# Patient Record
Sex: Male | Born: 1935 | Race: White | Hispanic: No | Marital: Married | Smoking: Never smoker
Health system: Southern US, Community
[De-identification: ages and names within clinical notes are randomized; demographics above are authoritative.]

## PROBLEM LIST (undated history)

## (undated) DIAGNOSIS — F0390 Unspecified dementia without behavioral disturbance: Secondary | ICD-10-CM

## (undated) DIAGNOSIS — Z862 Personal history of diseases of the blood and blood-forming organs and certain disorders involving the immune mechanism: Secondary | ICD-10-CM

## (undated) DIAGNOSIS — H3530 Unspecified macular degeneration: Secondary | ICD-10-CM

## (undated) DIAGNOSIS — I10 Essential (primary) hypertension: Secondary | ICD-10-CM

## (undated) DIAGNOSIS — I429 Cardiomyopathy, unspecified: Secondary | ICD-10-CM

## (undated) DIAGNOSIS — I4821 Permanent atrial fibrillation: Secondary | ICD-10-CM

## (undated) DIAGNOSIS — E119 Type 2 diabetes mellitus without complications: Secondary | ICD-10-CM

## (undated) DIAGNOSIS — K449 Diaphragmatic hernia without obstruction or gangrene: Secondary | ICD-10-CM

## (undated) DIAGNOSIS — Z7901 Long term (current) use of anticoagulants: Secondary | ICD-10-CM

## (undated) HISTORY — PX: HIP SURGERY: SHX245

## (undated) HISTORY — DX: Essential (primary) hypertension: I10

## (undated) HISTORY — PX: PROSTATE SURGERY: SHX751

## (undated) HISTORY — DX: Permanent atrial fibrillation: I48.21

## (undated) HISTORY — DX: Unspecified macular degeneration: H35.30

## (undated) HISTORY — DX: Personal history of diseases of the blood and blood-forming organs and certain disorders involving the immune mechanism: Z86.2

## (undated) HISTORY — DX: Unspecified dementia, unspecified severity, without behavioral disturbance, psychotic disturbance, mood disturbance, and anxiety: F03.90

## (undated) HISTORY — PX: CHOLECYSTECTOMY: SHX55

## (undated) HISTORY — DX: Type 2 diabetes mellitus without complications: E11.9

## (undated) HISTORY — DX: Long term (current) use of anticoagulants: Z79.01

## (undated) HISTORY — DX: Diaphragmatic hernia without obstruction or gangrene: K44.9

## (undated) HISTORY — DX: Cardiomyopathy, unspecified: I42.9

---

## 2007-02-13 ENCOUNTER — Ambulatory Visit: Payer: Self-pay | Admitting: Cardiology

## 2007-02-18 ENCOUNTER — Ambulatory Visit: Payer: Self-pay | Admitting: Cardiology

## 2007-03-24 ENCOUNTER — Ambulatory Visit: Payer: Self-pay | Admitting: Cardiology

## 2007-04-01 ENCOUNTER — Ambulatory Visit: Payer: Self-pay | Admitting: Cardiology

## 2007-04-27 ENCOUNTER — Ambulatory Visit: Payer: Self-pay | Admitting: Cardiology

## 2007-04-29 ENCOUNTER — Ambulatory Visit: Payer: Self-pay | Admitting: Cardiology

## 2007-05-06 ENCOUNTER — Ambulatory Visit: Payer: Self-pay | Admitting: Cardiology

## 2007-05-13 ENCOUNTER — Ambulatory Visit: Payer: Self-pay | Admitting: Cardiology

## 2007-05-20 ENCOUNTER — Ambulatory Visit: Payer: Self-pay | Admitting: Cardiology

## 2007-06-08 ENCOUNTER — Ambulatory Visit: Payer: Self-pay | Admitting: Cardiology

## 2007-07-03 ENCOUNTER — Encounter: Payer: Self-pay | Admitting: Cardiology

## 2007-07-04 ENCOUNTER — Encounter: Payer: Self-pay | Admitting: Cardiology

## 2007-07-06 ENCOUNTER — Ambulatory Visit: Payer: Self-pay | Admitting: Cardiology

## 2007-08-03 ENCOUNTER — Ambulatory Visit: Payer: Self-pay | Admitting: Cardiology

## 2007-08-31 ENCOUNTER — Ambulatory Visit: Payer: Self-pay | Admitting: Cardiology

## 2007-09-28 ENCOUNTER — Ambulatory Visit: Payer: Self-pay | Admitting: Cardiology

## 2007-10-26 ENCOUNTER — Ambulatory Visit: Payer: Self-pay | Admitting: Cardiology

## 2007-11-15 ENCOUNTER — Encounter: Payer: Self-pay | Admitting: Cardiology

## 2007-11-16 ENCOUNTER — Ambulatory Visit: Payer: Self-pay | Admitting: Internal Medicine

## 2007-11-16 ENCOUNTER — Inpatient Hospital Stay (HOSPITAL_COMMUNITY): Admission: AD | Admit: 2007-11-16 | Discharge: 2007-11-25 | Payer: Self-pay | Admitting: Orthopedic Surgery

## 2007-11-25 ENCOUNTER — Encounter: Payer: Self-pay | Admitting: Cardiology

## 2008-01-11 ENCOUNTER — Ambulatory Visit: Payer: Self-pay | Admitting: Cardiology

## 2008-02-16 ENCOUNTER — Ambulatory Visit: Payer: Self-pay | Admitting: Cardiology

## 2008-02-22 ENCOUNTER — Ambulatory Visit: Payer: Self-pay | Admitting: Cardiology

## 2008-03-08 ENCOUNTER — Ambulatory Visit: Payer: Self-pay | Admitting: Cardiology

## 2008-03-22 ENCOUNTER — Ambulatory Visit: Payer: Self-pay | Admitting: Cardiology

## 2008-03-29 ENCOUNTER — Encounter: Payer: Self-pay | Admitting: Cardiology

## 2008-04-12 ENCOUNTER — Ambulatory Visit: Payer: Self-pay | Admitting: Cardiology

## 2008-04-15 ENCOUNTER — Ambulatory Visit: Payer: Self-pay | Admitting: Cardiology

## 2008-04-29 ENCOUNTER — Ambulatory Visit: Payer: Self-pay | Admitting: Cardiology

## 2008-05-27 ENCOUNTER — Ambulatory Visit: Payer: Self-pay | Admitting: Cardiology

## 2008-06-17 ENCOUNTER — Ambulatory Visit: Payer: Self-pay | Admitting: Cardiology

## 2008-08-02 ENCOUNTER — Ambulatory Visit: Payer: Self-pay | Admitting: Cardiology

## 2008-08-23 ENCOUNTER — Ambulatory Visit: Payer: Self-pay | Admitting: Cardiology

## 2008-09-20 ENCOUNTER — Ambulatory Visit: Payer: Self-pay | Admitting: Cardiology

## 2008-09-27 ENCOUNTER — Ambulatory Visit: Payer: Self-pay | Admitting: Cardiology

## 2008-09-30 ENCOUNTER — Ambulatory Visit: Payer: Self-pay | Admitting: Cardiology

## 2008-09-30 IMAGING — RF DG FEMUR 2+V*R*
1 series · 4 of 4 positions shown · non-contrast
Comparison: None available.

CLINICAL DATA: Periprosthetic femur fracture

RIGHT FEMUR - 2 VIEW

[Series 1: run · 4 of 4 slices shown]
[im 1/4]
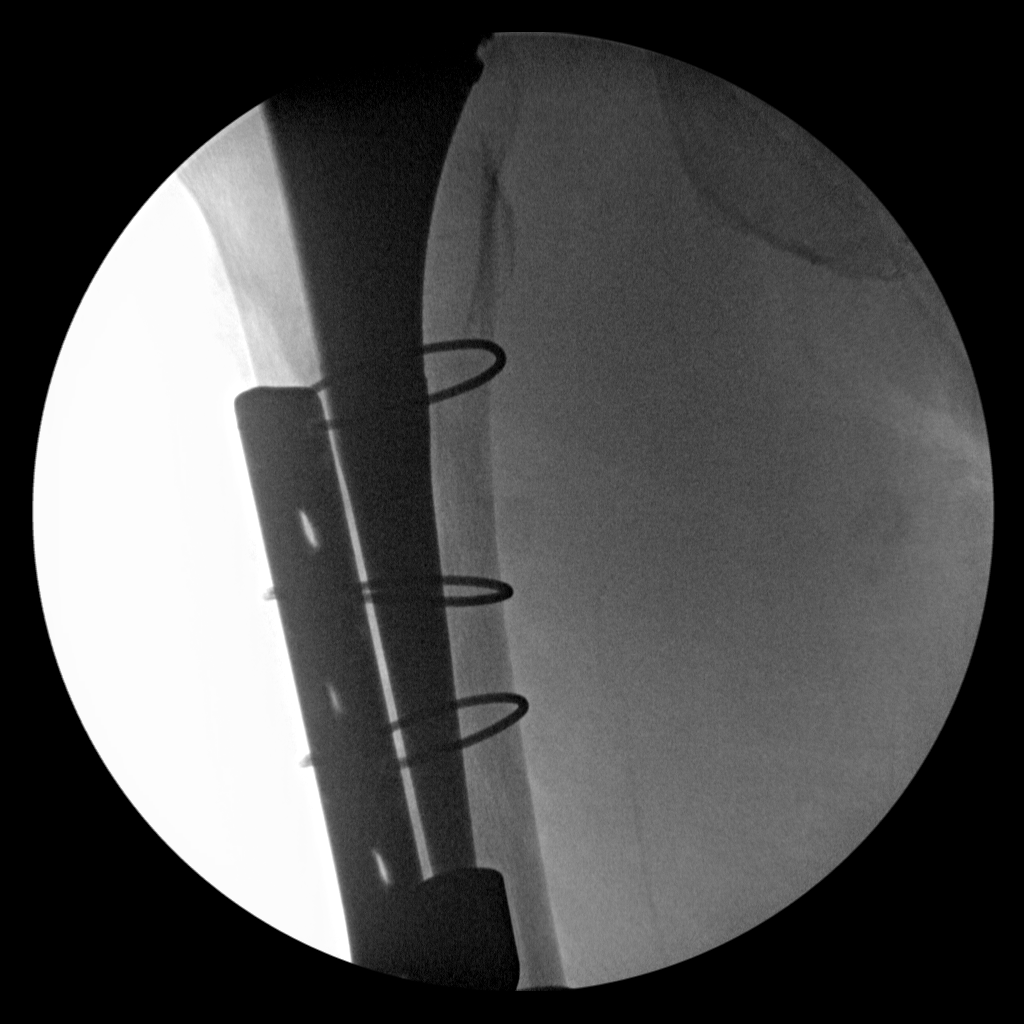
[im 2/4]
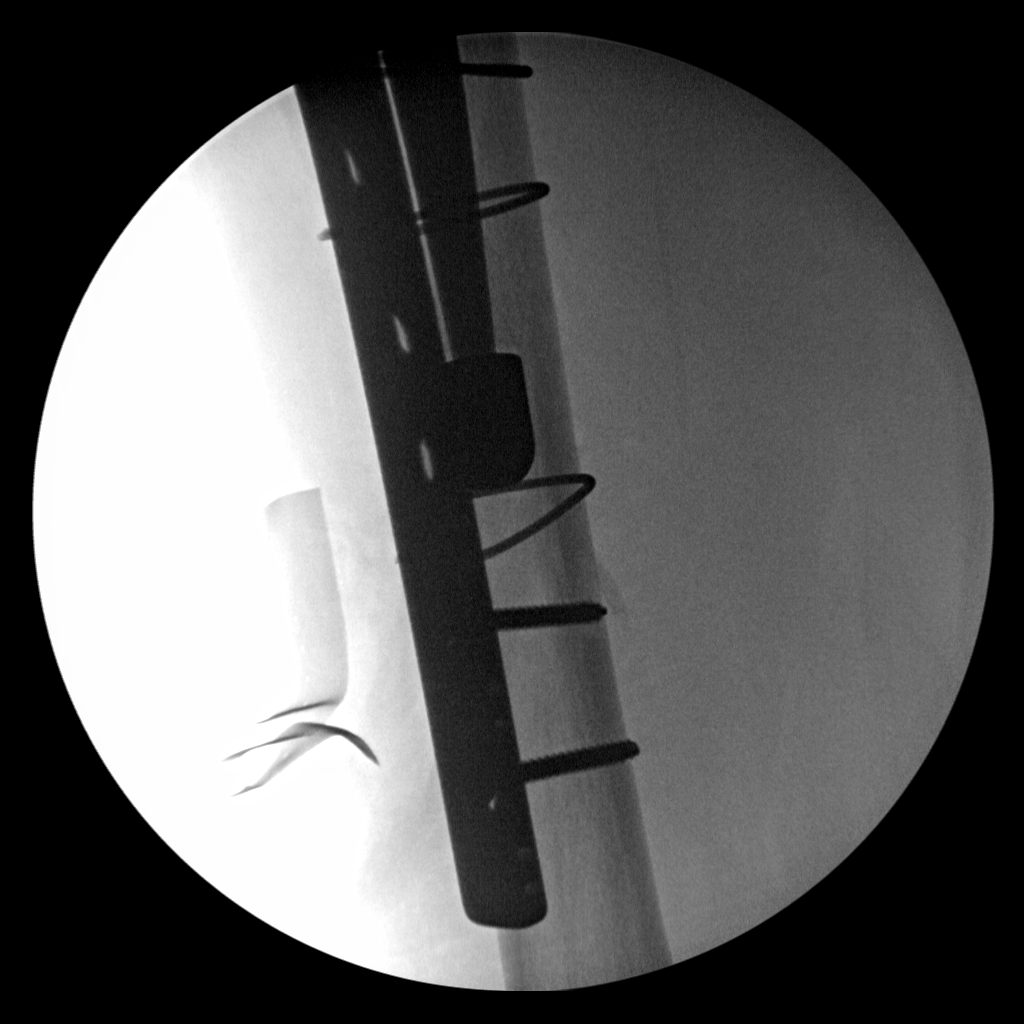
[im 3/4]
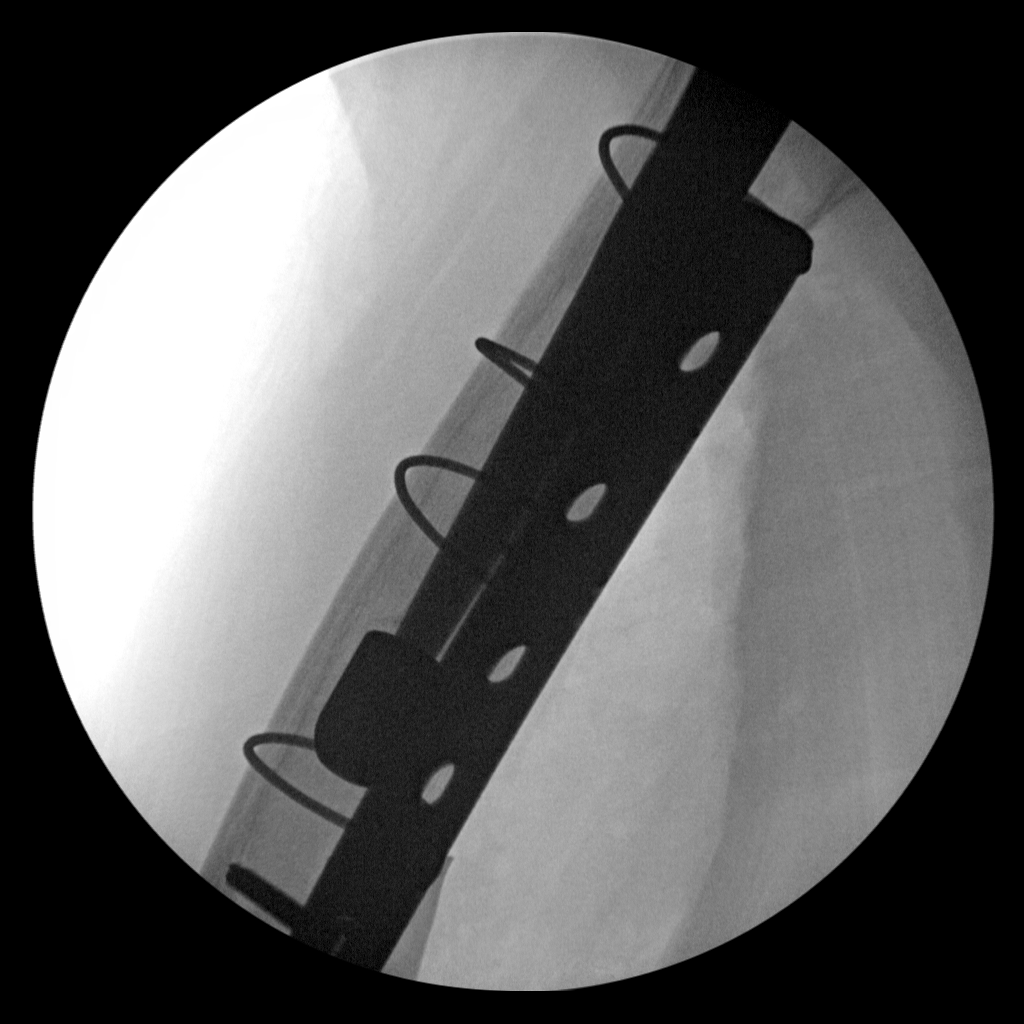
[im 4/4]
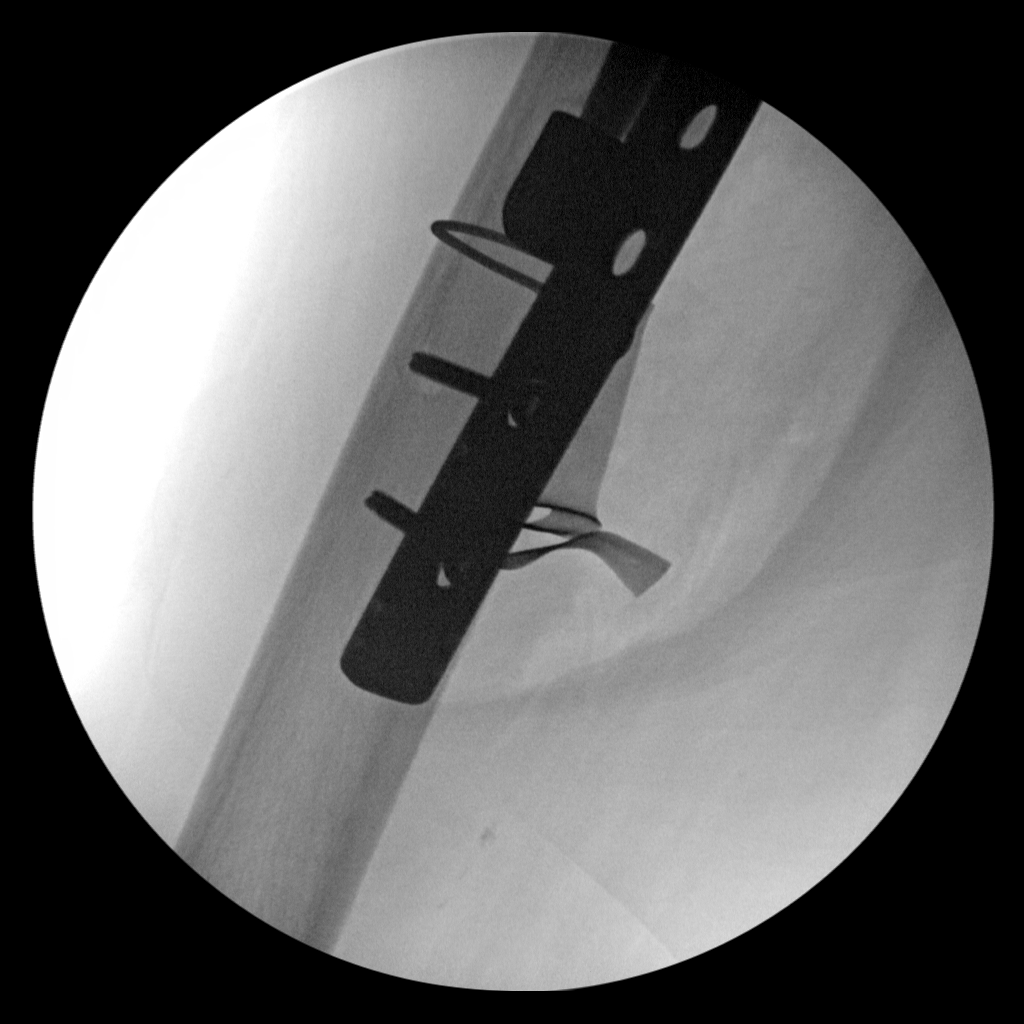

[4 of 4 positions shown; findings below may reference images not displayed]

FINDINGS: We are provided with four fluoroscopic intraoperative
spot views demonstrating placement of a plate with screws and
cerclage wires about the distal to thirds of a femoral stem of the
right hip replacement.
IMPRESSION: ORIF periprosthetic fracture right femur

## 2008-10-04 ENCOUNTER — Encounter: Payer: Self-pay | Admitting: Cardiology

## 2008-10-07 ENCOUNTER — Encounter: Payer: Self-pay | Admitting: Cardiology

## 2008-10-18 ENCOUNTER — Ambulatory Visit: Payer: Self-pay | Admitting: Cardiology

## 2008-10-28 ENCOUNTER — Ambulatory Visit: Payer: Self-pay | Admitting: Cardiology

## 2008-11-04 ENCOUNTER — Ambulatory Visit: Payer: Self-pay | Admitting: Cardiology

## 2008-11-08 ENCOUNTER — Ambulatory Visit: Payer: Self-pay

## 2008-11-08 ENCOUNTER — Encounter: Payer: Self-pay | Admitting: Cardiology

## 2008-11-14 ENCOUNTER — Ambulatory Visit: Payer: Self-pay | Admitting: Cardiology

## 2008-11-29 ENCOUNTER — Ambulatory Visit: Payer: Self-pay | Admitting: Cardiology

## 2008-12-27 ENCOUNTER — Ambulatory Visit: Payer: Self-pay

## 2009-01-17 ENCOUNTER — Ambulatory Visit: Payer: Self-pay | Admitting: Cardiology

## 2009-01-23 ENCOUNTER — Encounter: Payer: Self-pay | Admitting: *Deleted

## 2009-02-07 ENCOUNTER — Ambulatory Visit: Payer: Self-pay | Admitting: Cardiology

## 2009-02-07 LAB — CONVERTED CEMR LAB
POC INR: 2.4
Prothrombin Time: 18.8 s

## 2009-03-07 ENCOUNTER — Ambulatory Visit: Payer: Self-pay | Admitting: Cardiology

## 2009-03-07 LAB — CONVERTED CEMR LAB: POC INR: 2.7

## 2009-03-18 DIAGNOSIS — R5383 Other fatigue: Secondary | ICD-10-CM

## 2009-03-18 DIAGNOSIS — R5381 Other malaise: Secondary | ICD-10-CM

## 2009-03-18 DIAGNOSIS — I517 Cardiomegaly: Secondary | ICD-10-CM | POA: Insufficient documentation

## 2009-03-18 DIAGNOSIS — I4891 Unspecified atrial fibrillation: Secondary | ICD-10-CM | POA: Insufficient documentation

## 2009-04-04 ENCOUNTER — Ambulatory Visit: Payer: Self-pay | Admitting: Cardiology

## 2009-05-02 ENCOUNTER — Ambulatory Visit: Payer: Self-pay | Admitting: Cardiology

## 2009-05-30 ENCOUNTER — Ambulatory Visit: Payer: Self-pay | Admitting: Cardiology

## 2009-06-30 ENCOUNTER — Ambulatory Visit: Payer: Self-pay | Admitting: Cardiology

## 2009-07-03 ENCOUNTER — Ambulatory Visit: Payer: Self-pay | Admitting: Cardiology

## 2009-07-03 DIAGNOSIS — E119 Type 2 diabetes mellitus without complications: Secondary | ICD-10-CM

## 2009-07-14 ENCOUNTER — Encounter (INDEPENDENT_AMBULATORY_CARE_PROVIDER_SITE_OTHER): Payer: Self-pay | Admitting: *Deleted

## 2009-08-01 ENCOUNTER — Ambulatory Visit: Payer: Self-pay | Admitting: Cardiology

## 2009-08-29 ENCOUNTER — Ambulatory Visit: Payer: Self-pay | Admitting: Cardiology

## 2009-08-29 LAB — CONVERTED CEMR LAB: POC INR: 1.7

## 2009-09-19 ENCOUNTER — Ambulatory Visit: Payer: Self-pay | Admitting: Cardiology

## 2009-09-19 LAB — CONVERTED CEMR LAB: POC INR: 1.8

## 2009-10-10 ENCOUNTER — Ambulatory Visit: Payer: Self-pay | Admitting: Cardiology

## 2009-10-10 LAB — CONVERTED CEMR LAB: POC INR: 3.4

## 2009-11-07 ENCOUNTER — Ambulatory Visit: Payer: Self-pay | Admitting: Cardiology

## 2009-11-17 ENCOUNTER — Telehealth (INDEPENDENT_AMBULATORY_CARE_PROVIDER_SITE_OTHER): Payer: Self-pay | Admitting: *Deleted

## 2009-11-30 ENCOUNTER — Encounter: Payer: Self-pay | Admitting: Cardiology

## 2009-11-30 ENCOUNTER — Ambulatory Visit: Payer: Self-pay | Admitting: Physician Assistant

## 2009-11-30 DIAGNOSIS — I38 Endocarditis, valve unspecified: Secondary | ICD-10-CM | POA: Insufficient documentation

## 2009-11-30 LAB — CONVERTED CEMR LAB
INR: 2
POC INR: 2

## 2009-12-01 ENCOUNTER — Encounter: Payer: Self-pay | Admitting: Cardiology

## 2009-12-04 ENCOUNTER — Ambulatory Visit: Payer: Self-pay | Admitting: Cardiology

## 2009-12-14 ENCOUNTER — Ambulatory Visit: Payer: Self-pay | Admitting: Physician Assistant

## 2009-12-14 DIAGNOSIS — I10 Essential (primary) hypertension: Secondary | ICD-10-CM | POA: Insufficient documentation

## 2009-12-26 ENCOUNTER — Ambulatory Visit: Payer: Self-pay | Admitting: Cardiology

## 2010-01-23 ENCOUNTER — Ambulatory Visit: Payer: Self-pay | Admitting: Cardiology

## 2010-02-20 ENCOUNTER — Ambulatory Visit: Payer: Self-pay | Admitting: Cardiology

## 2010-03-20 ENCOUNTER — Ambulatory Visit: Payer: Self-pay | Admitting: Cardiology

## 2010-04-17 ENCOUNTER — Ambulatory Visit: Payer: Self-pay | Admitting: Cardiology

## 2010-05-15 ENCOUNTER — Ambulatory Visit: Payer: Self-pay | Admitting: Cardiology

## 2010-05-15 LAB — CONVERTED CEMR LAB: POC INR: 2.8

## 2010-06-12 ENCOUNTER — Ambulatory Visit: Admission: RE | Admit: 2010-06-12 | Discharge: 2010-06-12 | Payer: Self-pay | Source: Home / Self Care

## 2010-06-12 LAB — CONVERTED CEMR LAB: POC INR: 2.6

## 2010-07-02 ENCOUNTER — Ambulatory Visit
Admission: RE | Admit: 2010-07-02 | Discharge: 2010-07-02 | Payer: Self-pay | Source: Home / Self Care | Attending: Cardiology | Admitting: Cardiology

## 2010-07-10 ENCOUNTER — Ambulatory Visit: Admission: RE | Admit: 2010-07-10 | Discharge: 2010-07-10 | Payer: Self-pay | Source: Home / Self Care

## 2010-07-10 LAB — CONVERTED CEMR LAB: POC INR: 2.8

## 2010-07-10 NOTE — Medication Information (Signed)
Summary: ccr-lr  Anticoagulant Therapy  Managed by: Vashti Hey, RN PCP: Roswell Miners MD: Andee Lineman MD, Michelle Piper Indication 1: Atrial Fibrillation (ICD-427.31) [facility] [place] Site: [place] INR POC 1.7  Dietary changes: no    Health status changes: no    Bleeding/hemorrhagic complications: no    Recent/future hospitalizations: no    Any changes in medication regimen? no    Recent/future dental: no  Any missed doses?: no       Is patient compliant with meds? yes       Allergies: No Known Drug Allergies  Anticoagulation Management History:      The patient is taking warfarin and comes in today for a routine follow up visit.  Positive risk factors for bleeding include an age of 75 years or older and presence of serious comorbidities.  The bleeding index is 'intermediate risk'.  Positive CHADS2 values include Age > 64 years old and History of Diabetes.  The start date was 04/27/2007.  Anticoagulation responsible provider: Andee Lineman MD, Michelle Piper.  INR POC: 1.7.  Cuvette Lot#: [id number].  Exp: 10/11.    Anticoagulation Management Assessment/Plan:      The patient's current anticoagulation dose is Warfarin sodium 2.5 mg tabs: Take by mouth as directed.  The target INR is 2 - 3.  The next INR is due 09/19/2009.  Anticoagulation instructions were given to patient.  Results were reviewed/authorized by Vashti Hey, RN.  He was notified by Vashti Hey RN.         Prior Anticoagulation Instructions: INR 2.3 Continue coumadin 2.5mg  once daily except 1.25mg  on Sundays and Fridays  Current Anticoagulation Instructions: INR 1.7 Take coumadin 5mg  tonight then increase dose to 2.5mg  once daily except 1.25mg  on Fridays

## 2010-07-10 NOTE — Medication Information (Signed)
Summary: [facility]  Anticoagulant Therapy  Managed by: Weston Brass, PharmD PCP: Roswell Miners MD: Andee Lineman MD, Michelle Piper Indication 1: Atrial Fibrillation (ICD-427.31) Lab [facility] [place] Site: [place] INR POC 2.0  Dietary changes: no    Health status changes: no    Bleeding/hemorrhagic complications: no    Recent/future hospitalizations: no    Any changes in medication regimen? no    Recent/future dental: no  Any missed doses?: no       Is patient compliant with meds? yes       Allergies: No Known Drug Allergies  Anticoagulation Management History:      Positive risk factors for bleeding include an age of 76 years or older and presence of serious comorbidities.  The bleeding index is 'intermediate risk'.  Positive CHADS2 values include Age > 75 years old and History of Diabetes.  The start date was 04/27/2007.  His last INR was 2.0.  Anticoagulation responsible provider: Andee Lineman MD, Michelle Piper.  INR POC: 2.0.  Exp: 10/11.    Anticoagulation Management Assessment/Plan:      The patient's current anticoagulation dose is Warfarin sodium 2.5 mg tabs: Take by mouth as directed.  The target INR is 2 - 3.  The next INR is due 12/26/2009.  Anticoagulation instructions were given to patient.  Results were reviewed/authorized by Weston Brass, PharmD.  He was notified by Weston Brass PharmD.         Prior Anticoagulation Instructions: INR 2.5 Continue coumadin 2.5mg  once daily   Current Anticoagulation Instructions: INR 2.0  Take 5mg  today then resume same dose of 2.5mg  daily.  Recheck in 3 weeks.

## 2010-07-10 NOTE — Assessment & Plan Note (Signed)
Summary: 2 WK FU   Visit Type:  Follow-up Primary Provider:  Linna Darner   History of Present Illness: patient presents for early followup.  When last seen, I stopped Cardizem 360 mg daily, secondary to development of symptomatic bradycardia with rates in the 45-55 range, and placed him on Cardizem 120 mg daily. An early followup RN visit notable for a pulse of 90, with blood pressure 139/76. Patient reported symptomatic improvement at that time.  patient continues to report improvement in his exercise tolerance and energy level. He definitely attributes this to the reduced dose of Cardizem. Of note, his wife, who monitors his blood pressure at home, notes systolics in the 140 range, and pulse readings in the 60-80 bpm range.  Preventive Screening-Counseling & Management  Alcohol-Tobacco     Smoking Status: never  Current Medications (verified): 1)  Warfarin Sodium 2.5 Mg Tabs (Warfarin Sodium) .... Take By Mouth As Directed 2)  Exelon 3 Mg Caps (Rivastigmine Tartrate) .... Take 1 Tablet By Mouth Two Times A Day 3)  Glipizide Xl 5 Mg Xr24h-Tab (Glipizide) .... Take 1 Tablet By Mouth Once A Day 4)  Ranitidine Hcl 300 Mg Caps (Ranitidine Hcl) .... Take 1 Tablet By Mouth Once A Day 5)  Restasis 0.05 % Emul (Cyclosporine) .... Use As Directed 6)  Cardizem Cd 120 Mg Xr24h-Cap (Diltiazem Hcl Coated Beads) .... Take 1 Tablet By Mouth Once A Day  Allergies (verified): No Known Drug Allergies  Comments:  Nurse/Medical Assistant: The patient's medication list and allergies were reviewed with the patient and were updated in the Medication and Allergy Lists.  Past History:  Past Medical History: Last updated: 03/18/2009 VENTRICULAR HYPERTROPHY, LEFT (ICD-429.3) ATRIAL FIBRILLATION (ICD-427.31) FATIGUE / MALAISE (ICD-780.79) Diabetes mellitus  Review of Systems       No fevers, chills, hemoptysis, dysphagia, melena, hematocheezia, hematuria, rash, claudication, orthopnea, pnd, pedal edema.  All other systems negative.   Vital Signs:  Patient profile:   77 year old male Height:      68 inches Weight:      179 pounds Pulse rate:   64 / minute BP sitting:   150 / 100  (left arm) Cuff size:   regular  Vitals Entered By: Carlye Grippe (December 14, 2009 1:52 PM)  Serial Vital Signs/Assessments:  Time      Position  BP       Pulse  Resp  Temp     By 1:54 PM             134/80   97                    Carlye Grippe 2:31 PM   R Arm     122/76                         Cyril Loosen, RN, BSN 2:31 PM   L Arm     127/75                         Cyril Loosen, RN, BSN  Comments: 1:54 PM right arm/reg cuff By: Carlye Grippe    Physical Exam  Additional Exam:  GEN:77 year old male, sitting upright, in no distress HEENT: NCAT,PERRLA,EOMI NECK: palpable pulses, no bruits; no JVD; no TM LUNGS: CTA bilaterally HEART: irregularly regular (S1S2); no significant murmurs; no rubs; no gallops ABD: soft, NT; intact BS EXT: intact distal pulses; no edema SKIN: warm,  dry MUSC: no obvious deformity NEURO: A/O (x3)     Impression & Recommendations:  Problem # 1:  FATIGUE / MALAISE (ICD-780.79)  improved, following discontinuation of high dose Cardizem. Continue reduced dose of Cardizem 120 mg daily, for rate control of atrial fibrillation, as well as modulating high blood pressure.  Problem # 2:  ATRIAL FIBRILLATION (ICD-427.31)  rate controlled on reduced dose of Cardizem. Continue chronic Coumadin anticoagulation, followed in our clinic.  Problem # 3:  VALVULAR HEART DISEASE (ICD-424.90)  will plan followup 2-D echocardiogram in 6 months, prior to his next visit with Dr. Andee Lineman. His last study, April 2010, indicated moderate AR, MR, TR, and TR. Of note, he presents with no clinical signs or symptoms suggestive of heart failure.  Problem # 4:  ESSENTIAL HYPERTENSION, BENIGN (ICD-401.1)  repeat bilateral low-pressure readings are much improved, with 127/75 and 122/76. If he  were to develop persistently elevated readings, however, would recommend adding a different agent, and one which would not affect his heart rate.  Patient Instructions: 1)  Your physician wants you to follow-up in: 6 months. You will receive a reminder letter in the mail one-two months in advance. If you don't receive a letter, please call our office to schedule the follow-up appointment. 2)  Your physician has requested that you have an echocardiogram.  Echocardiography is a painless test that uses sound waves to create images of your heart. It provides your doctor with information about the size and shape of your heart and how well your heart's chambers and valves are working.  This procedure takes approximately one hour. There are no restrictions for this procedure. DO ECHO IN 6 MONTHS BEFORE YOUR FOLLOW UP APPT.

## 2010-07-10 NOTE — Assessment & Plan Note (Signed)
Summary: 6 MO FU REMINDER-SRS   Visit Type:  Follow-up Primary Provider:  Linna Darner  CC:  follow-up visit.  History of Present Illness: the patient is an 77 year old male with a history of permanent atrial fibrillation.  Last year the patient's significant complaints of weakness, dyspnea and fatigue.  A Holter monitor at that time revealed rapid heart rates up to 166 beats/min.  Adjustments were made in his medical regimen.  Since that time he is actually doing quite well.  The patient also less fatigue and weakness.  He denies any chest pain orthopnea PND or palpitations for that matter.  The patient has no significant cardiovascular complaints.  He is due for follow-up blood work.  EKG was reviewed and demonstrates atrial fibrillation with nonspecific ST-T wave changes.  Clinical Review Panels:  CXR CXR results The cardiopericardial silhouette is mildly enlarged.         There is no significant change of chronic interstitial prominence.         No focal airspace disease is seen.  Changes of COPD are noted.                   IMPRESSION:         1.  Cardiomegaly without failure.         2.  Changes of COPD.         3.  No acute cardiopulmonary disease. (03/29/2008)  Echocardiogram Echocardiogram Conclusions:         1. The estimated ejection fraction is 50-55%.          2. The left atrium is moderate to severely dilated.          3. The right ventricular cavity size is normal.         4. There is moderate aortic regurgitation.         5. There is mild to moderate mitral regurgitation.          6. There is mild to moderate tricuspid regurgitation.         7. The right ventricular systolic pressure is estimated to be 50-55         mmHg.          8. There is mild to moderate pulmonic regurgitation.          9. The pericardium appears normal.         10. The inferior vena cava appears normal. (09/30/2008)    Preventive Screening-Counseling & Management  Alcohol-Tobacco     Smoking  Status: never  Current Medications (verified): 1)  Cardizem Cd 360 Mg Xr24h-Cap (Diltiazem Hcl Coated Beads) .... Take 1 Capsule By Mouth Once A Day 2)  Warfarin Sodium 2.5 Mg Tabs (Warfarin Sodium) .... Take By Mouth As Directed 3)  Exelon 3 Mg Caps (Rivastigmine Tartrate) .... Take 1 Tablet By Mouth Two Times A Day 4)  Glipizide Xl 5 Mg Xr24h-Tab (Glipizide) .... Take 1 Tablet By Mouth Once A Day  Allergies (verified): No Known Drug Allergies  Comments:  Nurse/Medical Assistant: The patient is currently on medications but does not know the name or dosage at this time. Instructed to contact our office with details. Will update medication list at that time.  Past History:  Past Medical History: Last updated: 03/18/2009 VENTRICULAR HYPERTROPHY, LEFT (ICD-429.3) ATRIAL FIBRILLATION (ICD-427.31) FATIGUE / MALAISE (ICD-780.79) Diabetes mellitus  Past Surgical History: Last updated: 03/18/2009 Cholecystectomy Hip Arthroplasty-Total Prostatectomy  Family History: Last updated: 03/18/2009 Family History of Alcoholism:  Family  History of Cancer:  Family History of Coronary Artery Disease:  Family History of Diabetes:  Family History of Renal Disease:   Social History: Last updated: 03/18/2009 Retired  Disabled  Married  Tobacco Use - No.  Alcohol Use - no Regular Exercise - yes Drug Use - no  Risk Factors: Exercise: yes (03/18/2009)  Risk Factors: Smoking Status: never (07/03/2009)  Review of Systems  The patient denies fatigue, malaise, fever, weight gain/loss, vision loss, decreased hearing, hoarseness, chest pain, palpitations, shortness of breath, prolonged cough, wheezing, sleep apnea, coughing up blood, abdominal pain, blood in stool, nausea, vomiting, diarrhea, heartburn, incontinence, blood in urine, muscle weakness, joint pain, leg swelling, rash, skin lesions, headache, fainting, dizziness, depression, anxiety, enlarged lymph nodes, easy bruising or  bleeding, and environmental allergies.    Vital Signs:  Patient profile:   77 year old male Height:      68 inches Weight:      177 pounds Pulse rate:   73 / minute BP sitting:   125 / 75  (left arm) Cuff size:   regular  Vitals Entered By: Carlye Grippe (July 03, 2009 9:00 AM) CC: follow-up visit   Physical Exam  Additional Exam:  General: Well-developed, well-nourished in no distress head: Normocephalic and atraumatic eyes PERRLA/EOMI intact, conjunctiva and lids normal nose: No deformity or lesions mouth normal dentition, normal posterior pharynx neck: Supple, no JVD.  No masses, thyromegaly or abnormal cervical nodes lungs: Normal breath sounds bilaterally without wheezing.  Normal percussion heart: regular rate and rhythm with normal S1 and S2, no S3 or S4.  PMI is normal.  No pathological murmurs abdomen: Normal bowel sounds, abdomen is soft and nontender without masses, organomegaly or hernias noted.  No hepatosplenomegaly musculoskeletal: Back normal, normal gait muscle strength and tone normal pulsus: Pulse is normal in all 4 extremities Extremities: No peripheral pitting edema neurologic: Alert and oriented x 3 skin: Intact without lesions or rashes cervical nodes: No significant adenopathy psychologic: Normal affect    EKG  Procedure date:  07/03/2009  Findings:      atrial fibrillation nonspecific ST-T wave changes.  Impression & Recommendations:  Problem # 1:  ATRIAL FIBRILLATION (ICD-427.31) the patient is in permanent atrial fibrillation.  His rate however appears to well controlled on higher dose of Cardizem.  He denies any presyncope there is no suspicion of significant pauses. His updated medication list for this problem includes:    Warfarin Sodium 2.5 Mg Tabs (Warfarin sodium) .Marland Kitchen... Take by mouth as directed  Orders: EKG w/ Interpretation (93000) T-CBC w/Diff ([id number]) T-Comprehensive Metabolic Panel [id number]) T-Lipid Profile  ([id number]) T-PSA ([id number]) T-TSH ([id number])  Problem # 2:  FATIGUE / MALAISE (ICD-780.79) the patient will have laboratory work done as outlined below.  There does not appear to be a significant cardiovascular cause for his weakness.  The latter however has much improved. Orders: T-CBC w/Diff [id number]) T-Comprehensive Metabolic Panel [id number]) T-Lipid Profile [id number]) T-PSA ([id number]) T-TSH [id number])  Problem # 3:  DM (ICD-250.00) Assessment: Comment Only lipid panel will be obtained. His updated medication list for this problem includes:    Glipizide Xl 5 Mg Xr24h-tab (Glipizide) .Marland Kitchen... Take 1 tablet by mouth once a day  Problem # 4:  VENTRICULAR HYPERTROPHY, LEFT (ICD-429.3) Assessment: Comment Only  Orders: T-CBC w/Diff ([id number]) T-Comprehensive Metabolic Panel [phone]) T-Lipid Profile ([id number]) T-PSA ([id number]) T-TSH [phone])  Patient Instructions: 1)  Labs 2)  Follow up in  6 months

## 2010-07-10 NOTE — Progress Notes (Signed)
Summary: Weak, SOB  Phone Note Call from Patient Call back at [facility] Phone [phone]   Summary of Call: Wife, [place], called stating pt is SOB and weak. She states he's 77 yrs old and thinks he's suppose to be able to do what he use to but he just can't. She states they scheduled pt with [facility] on 6/23. She states he feels better today. Pt will keep appt for 6/23 and pt's wife will notify me Monday if he is not feeling better. If pt is not feeling better by Monday, we will try to schedule sooner than 6/23. Pt's wife is agreeable with this plan. Initial call taken by: Cyril Loosen, RN, BSN,  November 17, 2009 4:00 PM     Appended Document: Weak, SOB Pt's wife called back stating pt remains real SOB and weak. Notified pt's wife pt should be evaluated at ER or by primary MD, if he does not feel he can wait for scheduled appt on 6/23. She states pt saw primary MD yesterday and that was useless. Pt's wife aware pt should go then go to ER for evaluation of worsening SOB. Pt verbalized understanding.

## 2010-07-10 NOTE — Medication Information (Signed)
Summary: ccr-lr  Anticoagulant Therapy  Managed by: Vashti Hey, RN PCP: Roswell Miners MD: Andee Lineman MD, Michelle Piper Indication 1: Atrial Fibrillation (ICD-427.31) Lab [facility] [place] Site: [place] INR POC 2.5  Dietary changes: no    Health status changes: no    Bleeding/hemorrhagic complications: no    Recent/future hospitalizations: no    Any changes in medication regimen? no    Recent/future dental: no  Any missed doses?: no       Is patient compliant with meds? yes       Allergies: No Known Drug Allergies  Anticoagulation Management History:      The patient is taking warfarin and comes in today for a routine follow up visit.  Positive risk factors for bleeding include an age of 75 years or older and presence of serious comorbidities.  The bleeding index is 'intermediate risk'.  Positive CHADS2 values include History of HTN, Age > 38 years old, and History of Diabetes.  The start date was 04/27/2007.  His last INR was 2.0.  Anticoagulation responsible provider: Andee Lineman MD, Michelle Piper.  INR POC: 2.5.  Cuvette Lot#: [id number].  Exp: 10/11.    Anticoagulation Management Assessment/Plan:      The patient's current anticoagulation dose is Warfarin sodium 2.5 mg tabs: Take by mouth as directed.  The target INR is 2 - 3.  The next INR is due 03/20/2010.  Anticoagulation instructions were given to patient.  Results were reviewed/authorized by Vashti Hey, RN.  He was notified by Vashti Hey RN.         Prior Anticoagulation Instructions: INR 3.8 Hold coumadin tonight then resume 2.5mg  once daily  Has been taking Excedrine PM.  Will stop taking.  Current Anticoagulation Instructions: INR 2.5 Continue coumadin 2.5mg  once daily

## 2010-07-10 NOTE — Medication Information (Signed)
Summary: ccr-lr  Anticoagulant Therapy  Managed by: Vashti Hey, RN PCP: Roswell Miners MD: Antoine Poche MD, Fayrene Fearing Indication 1: Atrial Fibrillation (ICD-427.31) Lab [facility] [place] Site: [place] INR POC 3.8  Dietary changes: no    Health status changes: no    Bleeding/hemorrhagic complications: no    Recent/future hospitalizations: no    Any changes in medication regimen? no    Recent/future dental: no  Any missed doses?: no       Is patient compliant with meds? yes       Allergies: No Known Drug Allergies  Anticoagulation Management History:      The patient is taking warfarin and comes in today for a routine follow up visit.  Positive risk factors for bleeding include an age of 65 years or older and presence of serious comorbidities.  The bleeding index is 'intermediate risk'.  Positive CHADS2 values include History of HTN, Age > 75 years old, and History of Diabetes.  The start date was 04/27/2007.  His last INR was 2.0.  Anticoagulation responsible provider: Antoine Poche MD, Fayrene Fearing.  INR POC: 3.8.  Cuvette Lot#: [id number].  Exp: 10/11.    Anticoagulation Management Assessment/Plan:      The patient's current anticoagulation dose is Warfarin sodium 2.5 mg tabs: Take by mouth as directed.  The target INR is 2 - 3.  The next INR is due 02/20/2010.  Anticoagulation instructions were given to patient.  Results were reviewed/authorized by Vashti Hey, RN.  He was notified by Vashti Hey RN.         Prior Anticoagulation Instructions: INR 2.2 Continue coumadin 2.5mg  once daily   Current Anticoagulation Instructions: INR 3.8 Hold coumadin tonight then resume 2.5mg  once daily  Has been taking Excedrine PM.  Will stop taking.

## 2010-07-10 NOTE — Assessment & Plan Note (Signed)
Summary: vitals  Nurse Visit   Vital Signs:  Patient profile:   77 year old male Height:      68 inches Weight:      179 pounds Pulse rate:   90 / minute BP sitting:   139 / 76  (left arm) Cuff size:   regular  Vitals Entered By: Carlye Grippe (December 04, 2009 8:40 AM) CC: vital signs/med change Comments patient states he feels much better since diltiazem dose reduced   Visit Type:  nurse visit/vitals  CC:  vital signs/med change.   Preventive Screening-Counseling & Management  Alcohol-Tobacco     Smoking Status: never  Past History:  Past Medical History: Last updated: 03/18/2009 VENTRICULAR HYPERTROPHY, LEFT (ICD-429.3) ATRIAL FIBRILLATION (ICD-427.31) FATIGUE / MALAISE (ICD-780.79) Diabetes mellitus   Current Medications (verified): 1)  Warfarin Sodium 2.5 Mg Tabs (Warfarin Sodium) .... Take By Mouth As Directed 2)  Exelon 3 Mg Caps (Rivastigmine Tartrate) .... Take 1 Tablet By Mouth Two Times A Day 3)  Glipizide Xl 5 Mg Xr24h-Tab (Glipizide) .... Take 1 Tablet By Mouth Once A Day 4)  Ranitidine Hcl 300 Mg Caps (Ranitidine Hcl) .... Take 1 Tablet By Mouth Once A Day 5)  Restasis 0.05 % Emul (Cyclosporine) .... Use As Directed 6)  Cardizem Cd 120 Mg Xr24h-Cap (Diltiazem Hcl Coated Beads) .... Take 1 Tablet By Mouth Once A Day  Allergies (verified): No Known Drug Allergies  Comments:  Nurse/Medical Assistant: The patient's medication list and allergies were reviewed with the patient and were updated in the Medication and Allergy Lists.  Orders Added: 1)  Est. Patient Level I [21308]  vitals stable. Continue current medical therapy Lewayne Bunting, MD, [facility]  December 04, 2009 1:04 PM Patient's wife informed of the above.

## 2010-07-10 NOTE — Procedures (Signed)
Summary: Holter and Event/ [facility] PATIENT INFO  Holter and Event/ [facility] PATIENT INFO   Imported By: Dorise Hiss 06/30/2009 15:23:51  _____________________________________________________________________  External Attachment:    Type:   Image     Comment:   External Document

## 2010-07-10 NOTE — Medication Information (Signed)
Summary: ccr-lr  Anticoagulant Therapy  Managed by: Vashti Hey, RN PCP: Roswell Miners MD: Andee Lineman MD, Michelle Piper Indication 1: Atrial Fibrillation (ICD-427.31) Lab [facility] [place] Site: [place] INR POC 2.8  Dietary changes: no    Health status changes: no    Bleeding/hemorrhagic complications: no    Recent/future hospitalizations: no    Any changes in medication regimen? no    Recent/future dental: no  Any missed doses?: no       Is patient compliant with meds? yes       Allergies: No Known Drug Allergies  Anticoagulation Management History:      The patient is taking warfarin and comes in today for a routine follow up visit.  Positive risk factors for bleeding include an age of 32 years or older and presence of serious comorbidities.  The bleeding index is 'intermediate risk'.  Positive CHADS2 values include History of HTN, Age > 24 years old, and History of Diabetes.  The start date was 04/27/2007.  His last INR was 2.0.  Anticoagulation responsible provider: Andee Lineman MD, Michelle Piper.  INR POC: 2.8.  Cuvette Lot#: [id number].  Exp: 10/11.    Anticoagulation Management Assessment/Plan:      The patient's current anticoagulation dose is Warfarin sodium 2.5 mg tabs: Take by mouth as directed.  The target INR is 2 - 3.  The next INR is due 05/15/2010.  Anticoagulation instructions were given to patient.  Results were reviewed/authorized by Vashti Hey, RN.  He was notified by Vashti Hey RN.         Prior Anticoagulation Instructions: INR 2.9 Continue coumadin 2.5mg  once daily   Current Anticoagulation Instructions: INR 2.8 Continue coumadin 2.5mg  once daily

## 2010-07-10 NOTE — Medication Information (Signed)
Summary: ccr-lr  Anticoagulant Therapy  Managed by: Vashti Hey, RN PCP: Roswell Miners MD: Andee Lineman MD, Michelle Piper Indication 1: Atrial Fibrillation (ICD-427.31) Lab Used: [facility] [place] [facility] Site: [place] INR POC 2.3  Dietary changes: no    Health status changes: no    Bleeding/hemorrhagic complications: no    Recent/future hospitalizations: no    Any changes in medication regimen? no    Recent/future dental: no  Any missed doses?: no       Is patient compliant with meds? yes       Allergies: No Known Drug Allergies  Anticoagulation Management History:      The patient is taking warfarin and comes in today for a routine follow up visit.  Positive risk factors for bleeding include an age of 75 years or older and presence of serious comorbidities.  The bleeding index is 'intermediate risk'.  Positive CHADS2 values include Age > 80 years old and History of Diabetes.  The start date was 04/27/2007.  Anticoagulation responsible provider: Andee Lineman MD, Michelle Piper.  INR POC: 2.3.  Cuvette Lot#: [id number].  Exp: 10/11.    Anticoagulation Management Assessment/Plan:      The patient's current anticoagulation dose is Warfarin sodium 2.5 mg tabs: Take by mouth as directed.  The target INR is 2 - 3.  The next INR is due 08/29/2009.  Anticoagulation instructions were given to patient.  Results were reviewed/authorized by Vashti Hey, RN.  He was notified by Vashti Hey RN.         Prior Anticoagulation Instructions: INR 2.2 Continue coumadin 2.5mg  once daily except 1.25mg  on Sundays and Fridays  Current Anticoagulation Instructions: INR 2.3 Continue coumadin 2.5mg  once daily except 1.25mg  on Sundays and Fridays

## 2010-07-10 NOTE — Medication Information (Signed)
Summary: ccr-lr  Anticoagulant Therapy  Managed by: Vashti Hey, RN PCP: Roswell Miners MD: Andee Lineman MD, Michelle Piper Indication 1: Atrial Fibrillation (ICD-427.31) Lab [facility]: [facility] Site: [place] INR POC 2.9  Dietary changes: no    Health status changes: no    Bleeding/hemorrhagic complications: no    Recent/future hospitalizations: no    Any changes in medication regimen? no    Recent/future dental: no  Any missed doses?: no       Is patient compliant with meds? yes       Allergies: No Known Drug Allergies  Anticoagulation Management History:      The patient is taking warfarin and comes in today for a routine follow up visit.  Positive risk factors for bleeding include an age of 75 years or older and presence of serious comorbidities.  The bleeding index is 'intermediate risk'.  Positive CHADS2 values include History of HTN, Age > 47 years old, and History of Diabetes.  The start date was 04/27/2007.  His last INR was 2.0.  Anticoagulation responsible provider: Andee Lineman MD, Michelle Piper.  INR POC: 2.9.  Cuvette Lot#: [id number].  Exp: 10/11.    Anticoagulation Management Assessment/Plan:      The patient's current anticoagulation dose is Warfarin sodium 2.5 mg tabs: Take by mouth as directed.  The target INR is 2 - 3.  The next INR is due 04/17/2010.  Anticoagulation instructions were given to patient.  Results were reviewed/authorized by Vashti Hey, RN.  He was notified by Vashti Hey RN.         Prior Anticoagulation Instructions: INR 2.5 Continue coumadin 2.5mg  once daily   Current Anticoagulation Instructions: INR 2.9 Continue coumadin 2.5mg  once daily

## 2010-07-10 NOTE — Medication Information (Signed)
Summary: ccr-lr   r/s due to weather 1/18  Anticoagulant Therapy  Managed by: Vashti Hey, RN PCP: Roswell Miners MD: Diona Browner MD, Remi Deter Indication 1: Atrial Fibrillation (ICD-427.31) Lab [facility]: [facility] Site: [place] INR POC 2.2  Dietary changes: no    Health status changes: no    Bleeding/hemorrhagic complications: no    Recent/future hospitalizations: no    Any changes in medication regimen? no    Recent/future dental: no  Any missed doses?: no       Is patient compliant with meds? yes       Allergies: No Known Drug Allergies  Anticoagulation Management History:      The patient is taking warfarin and comes in today for a routine follow up visit.  Positive risk factors for bleeding include an age of 75 years or older.  The bleeding index is 'intermediate risk'.  Positive CHADS2 values include Age > 26 years old.  The start date was 04/27/2007.  Anticoagulation responsible provider: Diona Browner MD, Remi Deter.  INR POC: 2.2.  Cuvette Lot#: [id number].  Exp: 10/11.    Anticoagulation Management Assessment/Plan:      The patient's current anticoagulation dose is Warfarin sodium 2.5 mg tabs: Take by mouth as directed.  The target INR is 2 - 3.  The next INR is due 08/01/2009.  Anticoagulation instructions were given to patient.  Results were reviewed/authorized by Vashti Hey, RN.  He was notified by Vashti Hey RN.         Prior Anticoagulation Instructions: INR 2.2 Continue coumadin 2.5mg  once daily except 1.25mg  on Sundays and Fridays  Current Anticoagulation Instructions: Same as Prior Instructions.

## 2010-07-10 NOTE — Letter (Signed)
Summary: Engineer, materials at [facility]  [address]   [place], [place] [address]   Phone: [phone]  Fax: [phone]        July 14, 2009 MRN: [id number]   Phillip [name] [address] [place], [place]  [address]   Dear [name],  Your test ordered by Selena Batten has been reviewed by your physician (or physician assistant) and was found to be normal or stable. Your physician (or physician assistant) felt no changes were needed at this time.  ____ Echocardiogram  ____ Cardiac Stress Test  __X__ Lab Work    ____ Peripheral vascular study of arms, legs or neck  ____ CT scan or X-ray  ____ Lung or Breathing test  ____ Other:   Thank you.   Hoover Brunette, LPN    Duane Boston, M.D., F.A.C.C. Thressa Sheller, M.D., F.A.C.C. Oneal Grout, M.D., F.A.C.C. Cheree Ditto, M.D., F.A.C.C. Daiva Nakayama, M.D., F.A.C.C. Kenney Houseman, M.D., F.A.C.C. Jeanne Ivan, PA-C

## 2010-07-10 NOTE — Medication Information (Signed)
Summary: ccr-lr  Anticoagulant Therapy  Managed by: Vashti Hey, RN PCP: Roswell Miners MD: Antoine Poche MD, Fayrene Fearing Indication 1: Atrial Fibrillation (ICD-427.31) Lab [facility]: [facility] [place] Site: [place] INR POC 2.5  Dietary changes: no    Health status changes: no    Bleeding/hemorrhagic complications: no    Recent/future hospitalizations: no    Any changes in medication regimen? no    Recent/future dental: no  Any missed doses?: no       Is patient compliant with meds? yes       Allergies: No Known Drug Allergies  Anticoagulation Management History:      The patient is taking warfarin and comes in today for a routine follow up visit.  Positive risk factors for bleeding include an age of 75 years or older and presence of serious comorbidities.  The bleeding index is 'intermediate risk'.  Positive CHADS2 values include Age > 71 years old and History of Diabetes.  The start date was 04/27/2007.  Anticoagulation responsible provider: Antoine Poche MD, Fayrene Fearing.  INR POC: 2.5.  Cuvette Lot#: [id number].  Exp: 10/11.    Anticoagulation Management Assessment/Plan:      The patient's current anticoagulation dose is Warfarin sodium 2.5 mg tabs: Take by mouth as directed.  The target INR is 2 - 3.  The next INR is due 12/05/2009.  Anticoagulation instructions were given to patient.  Results were reviewed/authorized by Vashti Hey, RN.  He was notified by Vashti Hey RN.         Prior Anticoagulation Instructions: INR 3.4 Decrease coumadin to 2.5mg  once daily   Current Anticoagulation Instructions: INR 2.5 Continue coumadin 2.5mg  once daily

## 2010-07-10 NOTE — Medication Information (Signed)
Summary: ccr-lr  Anticoagulant Therapy  Managed by: Vashti Hey, RN PCP: Roswell Miners MD: Myrtis Ser MD, Tinnie Gens Indication 1: Atrial Fibrillation (ICD-427.31) Lab [facility] [place] Site: [place] INR POC 2.2  Dietary changes: no    Health status changes: no    Bleeding/hemorrhagic complications: no    Recent/future hospitalizations: no    Any changes in medication regimen? no    Recent/future dental: no  Any missed doses?: no       Is patient compliant with meds? yes       Allergies: No Known Drug Allergies  Anticoagulation Management History:      The patient is taking warfarin and comes in today for a routine follow up visit.  Positive risk factors for bleeding include an age of 75 years or older and presence of serious comorbidities.  The bleeding index is 'intermediate risk'.  Positive CHADS2 values include History of HTN, Age > 58 years old, and History of Diabetes.  The start date was 04/27/2007.  His last INR was 2.0.  Anticoagulation responsible provider: Myrtis Ser MD, Tinnie Gens.  INR POC: 2.2.  Cuvette Lot#: [id number].  Exp: 10/11.    Anticoagulation Management Assessment/Plan:      The patient's current anticoagulation dose is Warfarin sodium 2.5 mg tabs: Take by mouth as directed.  The target INR is 2 - 3.  The next INR is due 01/23/2010.  Anticoagulation instructions were given to patient.  Results were reviewed/authorized by Vashti Hey, RN.  He was notified by Vashti Hey RN.         Prior Anticoagulation Instructions: INR 2.0  Take 5mg  today then resume same dose of 2.5mg  daily.  Recheck in 3 weeks.   Current Anticoagulation Instructions: INR 2.2 Continue coumadin 2.5mg  once daily

## 2010-07-10 NOTE — Medication Information (Signed)
Summary: ccr-lr  Anticoagulant Therapy  Managed by: Vashti Hey, RN PCP: Roswell Miners MD: Diona Browner MD, Remi Deter Indication 1: Atrial Fibrillation (ICD-427.31) Lab [facility]: [facility] [place] Site: [place] INR POC 3.4  Dietary changes: no    Health status changes: no    Bleeding/hemorrhagic complications: no    Recent/future hospitalizations: no    Any changes in medication regimen? no    Recent/future dental: no  Any missed doses?: no       Is patient compliant with meds? yes       Allergies: No Known Drug Allergies  Anticoagulation Management History:      The patient is taking warfarin and comes in today for a routine follow up visit.  Positive risk factors for bleeding include an age of 75 years or older and presence of serious comorbidities.  The bleeding index is 'intermediate risk'.  Positive CHADS2 values include Age > 18 years old and History of Diabetes.  The start date was 04/27/2007.  Anticoagulation responsible provider: Diona Browner MD, Remi Deter.  INR POC: 3.4.  Cuvette Lot#: [id number].  Exp: 10/11.    Anticoagulation Management Assessment/Plan:      The patient's current anticoagulation dose is Warfarin sodium 2.5 mg tabs: Take by mouth as directed.  The target INR is 2 - 3.  The next INR is due 11/07/2009.  Anticoagulation instructions were given to patient.  Results were reviewed/authorized by Vashti Hey, RN.  He was notified by Vashti Hey RN.         Prior Anticoagulation Instructions: INR 1.8 INcrease coumadin to 2.5mg  once daily except 3.75mg  on Tuesdays  Current Anticoagulation Instructions: INR 3.4 Decrease coumadin to 2.5mg  once daily

## 2010-07-10 NOTE — Medication Information (Signed)
Summary: ccr-lr  Anticoagulant Therapy  Managed by: Vashti Hey, RN PCP: Roswell Miners MD: Andee Lineman MD, Michelle Piper Indication 1: Atrial Fibrillation (ICD-427.31) Lab [facility]: [facility] [place] Site: [place] INR POC 1.8  Dietary changes: no    Health status changes: no    Bleeding/hemorrhagic complications: no    Recent/future hospitalizations: no    Any changes in medication regimen? no    Recent/future dental: no  Any missed doses?: no       Is patient compliant with meds? yes       Allergies: No Known Drug Allergies  Anticoagulation Management History:      The patient is taking warfarin and comes in today for a routine follow up visit.  Positive risk factors for bleeding include an age of 75 years or older and presence of serious comorbidities.  The bleeding index is 'intermediate risk'.  Positive CHADS2 values include Age > 75 years old and History of Diabetes.  The start date was 04/27/2007.  Anticoagulation responsible provider: Andee Lineman MD, Michelle Piper.  INR POC: 1.8.  Cuvette Lot#: [id number].  Exp: 10/11.    Anticoagulation Management Assessment/Plan:      The patient's current anticoagulation dose is Warfarin sodium 2.5 mg tabs: Take by mouth as directed.  The target INR is 2 - 3.  The next INR is due 10/10/2009.  Anticoagulation instructions were given to patient.  Results were reviewed/authorized by Vashti Hey, RN.  He was notified by Vashti Hey RN.         Prior Anticoagulation Instructions: INR 1.7 Take coumadin 5mg  tonight then increase dose to 2.5mg  once daily except 1.25mg  on Fridays  Current Anticoagulation Instructions: INR 1.8 INcrease coumadin to 2.5mg  once daily except 3.75mg  on Tuesdays

## 2010-07-10 NOTE — Assessment & Plan Note (Signed)
Summary: ROV-SOB & WEAK   Visit Type:  SOB,weak Primary Provider:  Linna Darner   History of Present Illness: 77 year old Phillip [name] come with permanent atrial fibrillation, on chronic Coumadin, and normal LVEF, last seen here in January, by Dr. Andee Lineman. He presents today complaining of marked exertional fatigue and weakness, but denies chest pain, falls, dizziness or syncope. He also denies any medication adjustments, since his last visit.  12-lead EKG today indicates atrial fibrillation at 46 bpm, with occasional PVCs.  Patient had previous evaluation with a Holter monitor in May 2010, yielding rates as high as 166 bpm with an average of 85 bpm. Dr. Andee Lineman recommended uptitrating Cardizem to the current dose of 360 mg daily. He states his current symptoms developed approximately 2 months ago. In fact, his documented pulse was 73, at time of his last office visit.    Preventive Screening-Counseling & Management  Alcohol-Tobacco     Smoking Status: never  Current Medications (verified): 1)  Warfarin Sodium 2.5 Mg Tabs (Warfarin Sodium) .... Take By Mouth As Directed 2)  Exelon 3 Mg Caps (Rivastigmine Tartrate) .... Take 1 Tablet By Mouth Two Times A Day 3)  Glipizide Xl 5 Mg Xr24h-Tab (Glipizide) .... Take 1 Tablet By Mouth Once A Day 4)  Ranitidine Hcl 300 Mg Caps (Ranitidine Hcl) .... Take 1 Tablet By Mouth Once A Day 5)  Restasis 0.05 % Emul (Cyclosporine) .... Use As Directed 6)  Cardizem Cd 120 Mg Xr24h-Cap (Diltiazem Hcl Coated Beads) .... Take 1 Tablet By Mouth Once A Day  Allergies (verified): No Known Drug Allergies  Comments:  Nurse/Medical Assistant: The patient's medication list and allergies were reviewed with the patient and were updated in the Medication and Allergy Lists.  Past History:  Past Medical History: Last updated: 03/18/2009 VENTRICULAR HYPERTROPHY, LEFT (ICD-429.3) ATRIAL FIBRILLATION (ICD-427.31) FATIGUE / MALAISE (ICD-780.Phillip) Diabetes mellitus  Review of  Systems  The patient denies chest pain, syncope, and dyspnea on exertion.         remaining systems review, and are negative.  Vital Signs:  Patient profile:   77 year old Phillip [name] Height:      68 inches Weight:      180 pounds BMI:     27.47 O2 Sat:      96 % on Room air Pulse rate:   57 / minute BP sitting:   135 / 98  (left arm) Cuff size:   regular  Vitals Entered By: Carlye Grippe (November 30, 2009 2:52 PM)  O2 Flow:  Room air  Physical Exam  Additional Exam:  GEN:77 year old Phillip [name], sitting upright, in no distress HEENT: NCAT,PERRLA,EOMI NECK: palpable pulses, no bruits; no JVD; no TM LUNGS: CTA bilaterally HEART: irregularly regular (S1S2); no significant murmurs; no rubs; no gallops ABD: soft, NT; intact BS EXT: intact distal pulses; no edema SKIN: warm, dry MUSC: no obvious deformity NEURO: A/O (x3)     EKG  Procedure date:  11/30/2009  Findings:      atrial fibrillation at 46 bpm; normal axis; isolated PVCs; no acute changes  Impression & Recommendations:  Problem # 1:  ATRIAL FIBRILLATION (ICD-427.31)  patient has developed symptomatic bradycardia, with current EKG demonstrating atrial fibrillation at 46 bpm. I suspect, therefore, that these recent symptoms are related to this. I note that Exelon can have bradycardia as a possible adverse effect. However, his wife informs me that he has been taking this medication for quite some time now. We will discontinue Cardizem 360 mg and reduce dose to 120  mg daily, to be started in 48 hours. He will return here on Monday for a nurse visit, to obtain vital signs. We will also schedule an early followup visit with me and Dr. Andee Lineman in one week, for reassessment and further recommendations. Of note, we may need to consider the option of a permanent pacemaker, if he has developed tachybradycardia syndrome.  Problem # 2:  FATIGUE / MALAISE (ICD-780.Phillip) suspect secondary to problem #1.  Problem # 3:  VALVULAR HEART DISEASE  (ICD-424.90) history of moderate AR, MR, PR, and TR by last echo, 4/10. We'll continue to monitor clinically, and will consider a repeat study at time of next visit.  Other Orders: EKG w/ Interpretation (93000) Protime INR (16109)  Patient Instructions: 1)  Stop Cardizem 2)  Nurse visit on Monday, June 27 3)  Start Cardizem 120mg  daily - need to start 48 hours after last dose of Cardizem  4)  Follow up in  1 week.  Prescriptions: CARDIZEM CD 120 MG XR24H-CAP (DILTIAZEM HCL COATED BEADS) Take 1 tablet by mouth once a day  #30 x 6   Entered by:   Hoover Brunette, LPN   Authorized by:   Nelida Meuse, PA-C   Signed by:   Hoover Brunette, LPN on 60/45/4098   Method used:   Electronically to        [facility] [address] (retail)       [address]       [place], [place]  [address]       Ph: [phone] or [phone]       Fax: [phone]   RxID:   [id number]   Laboratory Results   Blood Tests   Date/Time Recieved: November 30, 2009 3:05 PM  Date/Time Reported: November 30, 2009 3:05 PM    INR: 2.0   (Normal Range: 0.88-1.12   Therap INR: 2.0-3.5)   I have reviewed and approved all prescriptions at the time of the office visit. Nelida Meuse, PA-C  November 30, 2009 3:50 PM

## 2010-07-10 NOTE — Medication Information (Signed)
Summary: ccr-lr  Anticoagulant Therapy  Managed by: Vashti Hey, RN PCP: Roswell Miners MD: Antoine Poche MD, Fayrene Fearing Indication 1: Atrial Fibrillation (ICD-427.31) Lab [facility] [place] Site: [place] INR POC 2.8  Dietary changes: no    Health status changes: no    Bleeding/hemorrhagic complications: no    Recent/future hospitalizations: no    Any changes in medication regimen? no    Recent/future dental: no  Any missed doses?: no       Is patient compliant with meds? yes       Allergies: No Known Drug Allergies  Anticoagulation Management History:      The patient is taking warfarin and comes in today for a routine follow up visit.  Positive risk factors for bleeding include an age of 75 years or older and presence of serious comorbidities.  The bleeding index is 'intermediate risk'.  Positive CHADS2 values include History of HTN, Age > 75 years old, and History of Diabetes.  The start date was 04/27/2007.  His last INR was 2.0.  Anticoagulation responsible provider: Antoine Poche MD, Fayrene Fearing.  INR POC: 2.8.  Cuvette Lot#: [id number].  Exp: 10/11.    Anticoagulation Management Assessment/Plan:      The patient's current anticoagulation dose is Warfarin sodium 2.5 mg tabs: Take by mouth as directed.  The target INR is 2 - 3.  The next INR is due 06/12/2010.  Anticoagulation instructions were given to patient.  Results were reviewed/authorized by Vashti Hey, RN.  He was notified by Vashti Hey RN.         Prior Anticoagulation Instructions: INR 2.8 Continue coumadin 2.5mg  once daily   Current Anticoagulation Instructions: Same as Prior Instructions.

## 2010-07-12 NOTE — Assessment & Plan Note (Signed)
Summary: 6 MO FUL   Visit Type:  Follow-up Primary Provider:  Linna Darner   History of Present Illness: the patient is a 77 -year-old male with a chronic atrial fibrillation.  Is on Coumadin therapy and reports no complications.  The patient states that he feels quite good.  He typically walks half a mile at a time.  He is asking for a request to use his exercise machine.  Had a single episode of palpitations several months ago which only lasted two to 3 minutes.  He denies any chest pain shortness of breath orthopnea PND.  He reports no presyncope or syncope.  Preventive Screening-Counseling & Management  Alcohol-Tobacco     Smoking Status: never  Current Medications (verified): 1)  Warfarin Sodium 2.5 Mg Tabs (Warfarin Sodium) .... Take By Mouth As Directed 2)  Exelon 3 Mg Caps (Rivastigmine Tartrate) .... Take 1 Tablet By Mouth Two Times A Day 3)  Glipizide Xl 5 Mg Xr24h-Tab (Glipizide) .... Take 1 Tablet By Mouth Once A Day 4)  Ranitidine Hcl 300 Mg Caps (Ranitidine Hcl) .... Take 1 Tablet By Mouth Once A Day 5)  Restasis 0.05 % Emul (Cyclosporine) .... Use As Directed 6)  Cardizem Cd 120 Mg Xr24h-Cap (Diltiazem Hcl Coated Beads) .... Take 1 Tablet By Mouth Once A Day 7)  Meloxicam 7.5 Mg Tabs (Meloxicam) .... Take 1 Tablet By Mouth Once A Day  Allergies (verified): No Known Drug Allergies  Comments:  Nurse/Medical Assistant: The patient's medications and allergies were reviewed with the patient and were updated in the Medication and Allergy Lists. reviewed w/ pt. Tammi Romine CMA (July 02, 2010 8:30 AM)  Past History:  Past Medical History: VENTRICULAR HYPERTROPHY, LEFT (ICD-429.3) ATRIAL FIBRILLATION (ICD-427.31) FATIGUE / MALAISE (ICD-780.79) Diabetes mellitus chronic warfarin therapy  Vital Signs:  Patient profile:   77 year old male Height:      68 inches Weight:      178 pounds BMI:     27.16 Pulse rate:   64 / minute BP sitting:   156 / 69  (left arm) Cuff  size:   regular  Vitals Entered By: Fuller Plan CMA (July 02, 2010 8:30 AM)  Serial Vital Signs/Assessments:  Time      Position  BP       Pulse  Resp  Temp     By 8:51 AM             124/86   79                    Hoover Brunette, LPN   Physical Exam  Additional Exam:  GEN:77 year old male, sitting upright, in no distress HEENT: NCAT,PERRLA,EOMI NECK: palpable pulses, no bruits; no JVD; no TM LUNGS: CTA bilaterally HEART: irregularly regular (S1S2); no significant murmurs; no rubs; no gallops ABD: soft, NT; intact BS EXT: intact distal pulses; no edema SKIN: warm, dry MUSC: no obvious deformity NEURO: A/O (x3)     Impression & Recommendations:  Problem # 1:  VALVULAR HEART DISEASE (ICD-424.90) the patient has valvular heart disease and we will need a follow-up echocardiogram during his next clinic visit.  However on physical exam there is no significant pathological murmurs in because he has a good functional status I do not see an indication to do an echocardiogram at the present time  Problem # 2:  ATRIAL FIBRILLATION (ICD-427.31) patient is in chronic atrial fibrillation.  His heart rate is well controlled.  He will continue on Coumadin.  He reports no complications His updated medication list for this problem includes:    Warfarin Sodium 2.5 Mg Tabs (Warfarin sodium) .Marland Kitchen... Take by mouth as directed  Problem # 3:  ESSENTIAL HYPERTENSION, BENIGN (ICD-401.1) while pressure was elevated on initial measurements however on repeat his blood pressure was only 124/86.  The patient did have an element of white coat hypertension. His updated medication list for this problem includes:    Cardizem Cd 120 Mg Xr24h-cap (Diltiazem hcl coated beads) .Marland Kitchen... Take 1 tablet by mouth once a day  Patient Instructions: 1)  Your physician recommends that you continue on your current medications as directed. Please refer to the Current Medication list given to you today. 2)  Follow up in  6  months Prescriptions: CARDIZEM CD 120 MG XR24H-CAP (DILTIAZEM HCL COATED BEADS) Take 1 tablet by mouth once a day  #30 x 6   Entered by:   Hoover Brunette, LPN   Authorized by:   Lewayne Bunting, MD, [facility]   Signed by:   Hoover Brunette, LPN on 25/36/6440   Method used:   Electronically to        [facility] [address] (retail)       [address]       [place], [place]  [address]       Ph: [phone] or [phone]       Fax: [phone]   RxID:   [id number]

## 2010-07-12 NOTE — Medication Information (Signed)
Summary: ccr-lr  Anticoagulant Therapy  Managed by: Vashti Hey, RN PCP: Roswell Miners MD: Diona Browner MD, Remi Deter Indication 1: Atrial Fibrillation (ICD-427.31) [facility]  Site: [place] INR POC 2.6  Dietary changes: no    Health status changes: no    Bleeding/hemorrhagic complications: no    Recent/future hospitalizations: no    Any changes in medication regimen? no    Recent/future dental: no  Any missed doses?: no       Is patient compliant with meds? yes       Allergies: No Known Drug Allergies  Anticoagulation Management History:      The patient is taking warfarin and comes in today for a routine follow up visit.  Positive risk factors for bleeding include an age of 75 years or older and presence of serious comorbidities.  The bleeding index is 'intermediate risk'.  Positive CHADS2 values include History of HTN, Age > 60 years old, and History of Diabetes.  The start date was 04/27/2007.  His last INR was 2.0.  Anticoagulation responsible provider: Diona Browner MD, Remi Deter.  INR POC: 2.6.  Cuvette Lot#: [id number].  Exp: 10/11.    Anticoagulation Management Assessment/Plan:      The patient's current anticoagulation dose is Warfarin sodium 2.5 mg tabs: Take by mouth as directed.  The target INR is 2 - 3.  The next INR is due 07/10/2010.  Anticoagulation instructions were given to patient.  Results were reviewed/authorized by Vashti Hey, RN.  He was notified by Vashti Hey RN.         Prior Anticoagulation Instructions: INR 2.8 Continue coumadin 2.5mg  once daily   Current Anticoagulation Instructions: INR 2.6 Continue coumadin 2.5mg  once daily

## 2010-07-18 NOTE — Medication Information (Signed)
Summary: ccr-lr  Anticoagulant Therapy  Managed by: Vashti Hey, RN PCP: Roswell Miners MD: Andee Lineman MD, Michelle Piper Indication 1: Atrial Fibrillation (ICD-427.31) [facility] [place] Site: [place] INR POC 2.8  Dietary changes: no    Health status changes: no    Bleeding/hemorrhagic complications: no    Recent/future hospitalizations: no    Any changes in medication regimen? no    Recent/future dental: no  Any missed doses?: no       Is patient compliant with meds? yes       Allergies: No Known Drug Allergies  Anticoagulation Management History:      The patient is taking warfarin and comes in today for a routine follow up visit.  Positive risk factors for bleeding include an age of 75 years or older and presence of serious comorbidities.  The bleeding index is 'intermediate risk'.  Positive CHADS2 values include History of HTN, Age > 84 years old, and History of Diabetes.  The start date was 04/27/2007.  His last INR was 2.0.  Anticoagulation responsible provider: Andee Lineman MD, Michelle Piper.  INR POC: 2.8.  Cuvette Lot#: [id number].  Exp: 10/11.    Anticoagulation Management Assessment/Plan:      The patient's current anticoagulation dose is Warfarin sodium 2.5 mg tabs: Take by mouth as directed.  The target INR is 2 - 3.  The next INR is due 08/07/2010.  Anticoagulation instructions were given to patient.  Results were reviewed/authorized by Vashti Hey, RN.  He was notified by Vashti Hey RN.         Prior Anticoagulation Instructions: INR 2.6 Continue coumadin 2.5mg  once daily   Current Anticoagulation Instructions: INR 2.8 Continue coumadin 2.5mg  once daily

## 2010-08-07 ENCOUNTER — Encounter: Payer: Self-pay | Admitting: Cardiology

## 2010-08-07 ENCOUNTER — Encounter (INDEPENDENT_AMBULATORY_CARE_PROVIDER_SITE_OTHER): Payer: Medicare Other

## 2010-08-07 DIAGNOSIS — Z7901 Long term (current) use of anticoagulants: Secondary | ICD-10-CM

## 2010-08-07 DIAGNOSIS — I4891 Unspecified atrial fibrillation: Secondary | ICD-10-CM

## 2010-08-16 NOTE — Medication Information (Signed)
Summary: ccr-lr  Anticoagulant Therapy  Managed by: Vashti Hey, RN PCP: Roswell Miners MD: Andee Lineman MD, Michelle Piper Indication 1: Atrial Fibrillation (ICD-427.31) Lab [facility] [place] Site: [place] INR POC 2.4  Dietary changes: no    Health status changes: no    Bleeding/hemorrhagic complications: no    Recent/future hospitalizations: no    Any changes in medication regimen? no    Recent/future dental: no  Any missed doses?: no       Is patient compliant with meds? yes       Allergies: No Known Drug Allergies  Anticoagulation Management History:      The patient is taking warfarin and comes in today for a routine follow up visit.  Positive risk factors for bleeding include an age of 75 years or older and presence of serious comorbidities.  The bleeding index is 'intermediate risk'.  Positive CHADS2 values include History of HTN, Age > 58 years old, and History of Diabetes.  The start date was 04/27/2007.  His last INR was 2.0.  Anticoagulation responsible provider: Andee Lineman MD, Michelle Piper.  INR POC: 2.4.  Cuvette Lot#: [id number].  Exp: 10/11.    Anticoagulation Management Assessment/Plan:      The patient's current anticoagulation dose is Warfarin sodium 2.5 mg tabs: Take by mouth as directed.  The target INR is 2 - 3.  The next INR is due 09/04/2010.  Anticoagulation instructions were given to patient.  Results were reviewed/authorized by Vashti Hey, RN.  He was notified by Vashti Hey RN.         Prior Anticoagulation Instructions: INR 2.8 Continue coumadin 2.5mg  once daily   Current Anticoagulation Instructions: INR 2.4 Continue coumadin 2.5mg  once daily

## 2010-08-31 ENCOUNTER — Encounter: Payer: Self-pay | Admitting: Cardiology

## 2010-08-31 DIAGNOSIS — Z7901 Long term (current) use of anticoagulants: Secondary | ICD-10-CM | POA: Insufficient documentation

## 2010-08-31 DIAGNOSIS — I4891 Unspecified atrial fibrillation: Secondary | ICD-10-CM

## 2010-09-01 ENCOUNTER — Encounter: Payer: Self-pay | Admitting: Cardiology

## 2010-09-01 DIAGNOSIS — Z7901 Long term (current) use of anticoagulants: Secondary | ICD-10-CM

## 2010-09-01 DIAGNOSIS — I4891 Unspecified atrial fibrillation: Secondary | ICD-10-CM

## 2010-09-01 DIAGNOSIS — I38 Endocarditis, valve unspecified: Secondary | ICD-10-CM

## 2010-09-04 ENCOUNTER — Ambulatory Visit (INDEPENDENT_AMBULATORY_CARE_PROVIDER_SITE_OTHER): Payer: Medicare Other | Admitting: *Deleted

## 2010-09-04 DIAGNOSIS — I4891 Unspecified atrial fibrillation: Secondary | ICD-10-CM

## 2010-09-04 DIAGNOSIS — Z7901 Long term (current) use of anticoagulants: Secondary | ICD-10-CM

## 2010-09-04 LAB — POCT INR: INR: 3.5

## 2010-10-02 ENCOUNTER — Ambulatory Visit (INDEPENDENT_AMBULATORY_CARE_PROVIDER_SITE_OTHER): Payer: Medicare Other | Admitting: *Deleted

## 2010-10-02 DIAGNOSIS — I4891 Unspecified atrial fibrillation: Secondary | ICD-10-CM

## 2010-10-02 DIAGNOSIS — Z7901 Long term (current) use of anticoagulants: Secondary | ICD-10-CM

## 2010-10-02 LAB — POCT INR: INR: 2.7

## 2010-10-23 NOTE — Assessment & Plan Note (Signed)
[facility]                          [facility] NOTE   JABE, JEANBAPTISTE                        MRN:          [id number]  DATE:03/24/2007                            DOB:          [birth year]    PRIMARY CARDIOLOGIST:  Dr. Lewayne Bunting.   REASON FOR VISIT:  Post hospital followup.   Patient presents to our clinic today in followup of recent brief  hospitalization here at [facility], where he was referred to [place] in  consultation for evaluation of persistent weakness.   Unlike his previous brief stay, at which time he was seen by Dr. Erling Conte for recommendations regarding management of atrial fibrillation  in the setting of orthostatic hypotension, the patient, this time, did  not present with orthostatic hypotension.  He had also not had any  recent falls.  In fact, he returned back to the hospital only 2 days  after being discharged.   Our recommendations at this time, therefore, were to proceed with  outpatient CardioNet monitoring for assessment of heart rate  variability.  We also adjusted medications with down titration of  digoxin to 0.125 daily and initiation of rate control with Diltiazem 30  t.i.d.   Clinically, patient reports no significant change from his baseline  level of weakness.  He continues to report no chest pain, no tachy  palpitations or fluttering, but does have some exertional dyspnea  according to his wife.   Review of CardioNet monitor strips suggests atrial fibrillation with  variable rates from as low as 50 beats per minute range to the 170s.  There are also occasional pauses extending from 2 to 2.5 seconds in  duration.  There were also transient episodes of nonsustained  ventricular tachycardia, less than 10 beats in duration.  Of note, there  were no associated symptoms with the elevated heart rate or with the  transient NSVT.   Patient also had a recent 2D echocardiogram revealing normal left  ventricular function (55% - 60%); moderate concentric LVH; moderate left  atrial enlargement.   CURRENT MEDICATIONS:  1. Full dose aspirin.  2. Diltiazem 30 t.i.d.  3. Digoxin 0.125 daily.  4. Glipizide XL 10 mg daily.  5. Namenda 10 mg daily.   PHYSICAL EXAMINATION:  Blood pressure 134/71, pulse 69 irregular, weight  161.  An 77 year old male sitting upright in no distress.  HEENT:  Normocephalic/atraumatic.  NECK:  Palpable bilateral carotid pulses without bruits; no JVD.  LUNGS:  Clear to auscultation throughout.  HEART:  Irregularly regular (S1-S2), no significant murmurs.  ABDOMEN:  Benign.  EXTREMITIES:  No significant edema.  NEURO:  No focal deficit.   IMPRESSION:  1. Permanent atrial fibrillation.      a.     CHAD2 score: 3.  2. Preserved left ventricular function.  3. Hypertension.  4. Type 2 diabetes mellitus.  5. Status post recent orthostatic hypotension.  6. Macular degeneration.   PLAN:  1. Discontinue short acting diltiazem and start Cardizem CD 120 mg      daily, for better heart rate control.  Patient is otherwise to  continue on digoxin for additional rate control.  2. Schedule low level adenosine stress Cardiolite for risk      stratification.  3. Defer consideration of initiating Coumadin anticoagulation, pending      evaluation of the ischemic workup.  Patient will therefore continue      on full dose aspirin.  4. Schedule return clinic followup with myself and Dr. Andee Lineman in 1      month, for review of study results and further recommendations.      Gene Serpe, PA-C  Electronically Signed      Learta Codding, MD,FACC  Electronically Signed   GS/MedQ  DD: 03/24/2007  DT: 03/25/2007  Job #: [id number]   cc:   Doreen Beam

## 2010-10-23 NOTE — Assessment & Plan Note (Signed)
[facility]                          [facility]ICE NOTE   Phillip [name], Phillip [name]                        MRN:          [id number]  DATE:01/11/2008                            DOB:          07-11-1921    PRIMARY CARE PHYSICIAN:  Erasmo Downer, MD   PRIMARY CARDIOLOGIST:  Learta Codding, MD, [facility]   REASON FOR VISIT:  Scheduled followup.   HISTORY OF PRESENT ILLNESS:  I am seeing Phillip [name] in Dr. Margarita Mail  absence today.  He is a very pleasant gentleman in his mid 72s with a  history of permanent atrial fibrillation, CHADS2 score of 3, managed  with a strategy of rate control and anticoagulation.  He continues to do  well without any palpitations or major bleeds.  He states that he fell  off the ladder back in June and required surgery for right femur  fracture.  This was done by Dr. Priscille Kluver in [place].  He is doing well  and is in fact completing home health rehabilitation next week.  He will  progress from a four-point walker to no walker as of next week.  He is  not reporting any problems with chest pain, dizziness, or syncope.   ALLERGIES:  No known drug allergies.   PRESENT MEDICATIONS:  1. Diltiazem CD 120 mg p.o. daily.  2. Coumadin as directed by the [facility].  3. Exelon 3 mg p.o. b.i.d.  4. Glipizide XL 5 mg p.o. daily.  5. Zolpidem p.r.n.   REVIEW OF SYSTEMS:  As per history of present illness and otherwise  negative.   PHYSICAL EXAMINATION:  VITAL SIGNS:  Blood pressure is 136/78, heart  rate is 68 and irregular, weight is 177 pounds.  GENERAL:  The patient is comfortable, in no acute distress.  NECK:  Reveals no elevated jugular venous pressure, no loud bruits.  No  thyromegaly is noted.  LUNGS:  Clear without labored breathing.  CARDIAC:  Reveals a regularly irregular rhythm without S3 gallop or  pathologic murmur.  EXTREMITIES:  Exhibit trace edema, symmetric.   IMPRESSION AND RECOMMENDATIONS:  1. Permanent  atrial fibrillation.  We will continue the strategy of      heart rate control and anticoagulation.  He is doing well      clinically without palpitations, chest pain, or dizziness.  He has      had no major bleeding problems.  2. Previously documented normal left ventricular systolic function      with moderate left ventricular hypertrophy.  Continue hypertension      management per Dr. Linna Darner.     Jonelle Sidle, MD  Electronically Signed    SGM/MedQ  DD: 01/11/2008  DT: 01/12/2008  Job #: [id number]   cc:   Erasmo Downer, MD  Learta Codding, MD,FACC

## 2010-10-23 NOTE — Assessment & Plan Note (Signed)
[facility]                          [facility] OFF[facility]   [name], [name]                        MRN:          [id number]  DATE:11/14/2008                            DOB:          10/26/1921    HISTORY OF PRESENT ILLNESS:  The patient is an 77 year old male with  permanent atrial fibrillation.  In his last office visit, he complained  of weakness, dyspnea and fatigue, which I felt was related to rapid  heart rate.  On a Holter monitor, his heart rate had been up to 166  beats per minute.  We increased his Cardizem as he had no significant  pauses.  He is now at 360 mg p.o. daily and doing very well.  The  patient states that all his weakness and fatigue has completely  resolved.  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   MEDICATIONS:  1. Warfarin as directed.  2. Exelon 3 mg p.o. b.i.d.  3. Glipizide XL 5 mg p.o. daily.  4. Diltiazem 360 mg p.o. daily.   PHYSICAL EXAMINATION:  VITAL SIGNS:  Blood pressure is 120/70, heart  rate 64, weight is 173 pounds.  GENERAL:  A well-nourished white male in no apparent distress.  HEENT:  Pupils are isochoric.  Conjunctivae are clear.  NECK:  Supple.  Normal carotid upstroke and no carotid bruits.  LUNGS:  Clear breath sounds bilaterally.  HEART:  Irregular rate and rhythm with normal S1 and S2.  ABDOMEN:  Soft, nontender.  No rebound or guarding.  Good bowel sounds.  EXTREMITIES:  No cyanosis, clubbing, or edema.  NEURO:  The patient is alert, oriented, and grossly nonfocal.   PROBLEM LIST:  1. Atrial fibrillation, questionable tachy-brady syndrome.  2. Uncontrolled heart rate by 48-hour Holter monitor, improved on      Cardizem.  3. Fatigue and weakness secondary to rapid heart rate.  4. Diabetes mellitus.  5. Left ventricular hypertrophy with significant left atrial      enlargement.   PLAN:  1. The patient has markedly improved with more aggressive  rate      control.  He has had no presyncope or syncope or dizzy spells.  2. I have cancelled the patient's EP appointment for June 15, as it is      clear that at this point in      time he does not need a pacemaker.  He does have a tachy-brady      syndrome, but it does not appear that he has any significant pauses      for right now.     Phillip Codding, MD,FACC  Electronically Signed    GED/MedQ  DD: 11/14/2008  DT: 11/15/2008  Job #: [id number]   cc:   Phillip Downer, MD

## 2010-10-23 NOTE — Assessment & Plan Note (Signed)
Phillip [name]                          [facility] NOTE   Phillip [name], Phillip [name]                        MRN:          [id number]  DATE:09/27/2008                            DOB:          09-21-1921    REFERRING PHYSICIAN:  Erasmo Downer, MD   HISTORY OF PRESENT ILLNESS:  The patient is an 77 year old male with a  history of permanent atrial fibrillation who was initially placed on a  heart rate control strategy.  He is on anticoagulation with no  complications.  The patient has normal LV function with mild left  ventricular hypertrophy and moderate left atrial enlargement.  The  patient has been in atrial fibrillation for several years.  Up until he  saw Dr. Diona Browner, January 11, 2008, he was doing well.  He now complains  of symptoms of fatigue and weakness.  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  He denies, however, any shortness of breath or chest pain.  He just feels he has no energy.  His EKG in the office shows atrial  fibrillation with a controlled heart rate.  However, a CardioNet monitor  from 2008 shows that the patient had significant tachy-brady syndrome  with occasional pauses and heart rates up to the 130-140 rate.   MEDICATIONS:  1. Glipizide XL 5 mg.  2. Exelon 3 mg p.o. b.i.d.  3. Warfarin as directed with an INR of 2.4 recently.  4. Diltiazem 120 mg p.o. daily.   PHYSICAL EXAMINATION:  VITAL SIGNS:  Blood pressure 152/74, heart rate  89, weight 176.  GENERAL:  Well-nourished white male in no apparent distress.  HEENT:  Pupils, eyes are equal.  Conjunctivae clear.  NECK:  Supple, normal carotid stroke, and no carotid bruits.  LUNGS:  Clear breath sounds bilaterally.  HEART:  Irregular rate and rhythm.  Normal S1 and S2.  There is a soft  systolic murmur approximately 2/6.   He also needs an echo.   PROBLEM LIST:  1. Atrial fibrillation  (tachycardia-bradycardia syndrome).  2. Fatigue and weakness rule out secondary to pauses or uncontrolled      heart rates.  3. Diabetes mellitus.  4. Left ventricular hypertrophy and significant left atrial      enlargement.   PLAN:  1. The patient likely has symptomatic either rapid heart rates or slow      heart rates.  This has been previously documented but previously he      had no symptoms.  I do not think that he has angina at this point      in time, and he had a stress test several years ago which showed no      definite ischemia.  2. The plan is to give the patient a 48-hour Holter monitor.  If he      has significant tachy-brady syndrome without major pauses, we could      initially cardiovert him but this will need to be under therapy      with amiodarone.  3. If the patient has significant and prolonged pauses, the      alternative strategy will be to place a pacemaker with further rate      control with medical therapy.  It is also unlikely that he will      remain in normal sinus rhythm given his significant left atrial      enlargement previously documented.  We will also order a repeat      echocardiogram in the interim period.     Learta Codding, MD,FACC  Electronically Signed    GED/MedQ  DD: 09/27/2008  DT: 09/27/2008  Job #: [id number]   cc:   Erasmo Downer, MD

## 2010-10-23 NOTE — Op Note (Signed)
NAME:  Phillip [name], Phillip [name]               ACCOUNT NO.:  [id number]   MEDICAL RECORD NO.:  [id number]          PATIENT TYPE:  INP   LOCATION:  1601                         FACILITY:  [facility]   PHYSICIAN:  John L. Rendall, M.D.  DATE OF BIRTH:  1921-06-25   DATE OF PROCEDURE:  11/19/2007  DATE OF DISCHARGE:                               OPERATIVE REPORT   PREOPERATIVE DIAGNOSIS:  Periprosthetic femur fracture right total hip.   POSTOPERATIVE DIAGNOSIS:  Periprosthetic femur fracture right total hip.   SURGICAL PROCEDURE:  Cerclage wire plate and screw fixation of femur  fracture.   SURGEON:  John L. Rendall, M.D.   ASSISTANT:  Legrand Pitts. Duffy, P.A.   ANESTHESIA:  General.   PATHOLOGY:  The patient has a 12-year-old total hip prosthesis that is  functioning well enough to let this 77 year old climb a ladder and  subsequently fall off.  It extends from 1 inch above the distal tip to  the level of the lesser trochanter and there is only 1.5 mm displacement  at the distal end.  The femoral component is well fixed in the femur and  there is no other pathology with the total hip.  He was admitted to  [facility] and monitored for several days while his INR  returned to normal due to Coumadin treatment.   PROCEDURE:  Under general anesthesia, the patient is placed in the left  lateral decubitus position.  His previous hip incision was utilized over  the distal 8 inches and extended with an anterior dog-leg distal 3  inches.  Dissection was carried down through skin and subcutaneous  tissue, splitting the IT band.  The vastus lateralis was then split just  anterior to its approach of the linea aspera and dissection was taken to  bone with the electrocautery.  Multiple small vessels and more than one  perforator were cauterized.  Some bleeding was encountered with the  perforators but was controlled.  The C-arm was then brought in and the  decision was made to use a 187 mm Zimmer plate  that would allow three  cables to hold the fracture proximally and a cerclage wire and two  screws distally.  The dissection around the bone was done to the point  where cerclage wires could be passed from posteriorly laterally to  anteriorly laterally.  There were then passed into the plate and three  of them at the proximal end were tensioned.  The most proximal one was  then retention and tightened.  This was done of course after cleaning a  little blood clot from the slightly displaced fracture and manipulating  it ever so slightly for better reduction.  With the fracture thus  reduced, the proximal cable cerclage wire was tightened down, clamped  and cut.  Then, in the distal end below the fracture just below the  bullet head of the prosthesis, a cable in this location was then  tightened down and clamped.  It should be noted we encountered some  bleeding from one of the perforators at this level.  It was stubborn and  required  packing and multiple attempts at cautery.  Finally when it did  not see she is bleeding, we went ahead and used FloSeal hemostatic  matrix to stop this bleeding.  Once this was in place, we proceeded with  tightening all the proximal cables and inserted two distal screws.  X-  rays revealed excellent position and alignment of the apparatus.  With this in place, bleeding was found to be stopped with FloSeal, the  wound was then closed in layers with #1 Vicryl, #1 Vicryl, 2-0 Vicryl  and skin clips.  Operative time an hour and 45 minutes.  The patient  tolerated the procedure well and returned to recovery in good condition.      John L. Rendall, M.D.  Electronically Signed     JLR/MEDQ  D:  11/19/2007  T:  11/19/2007  Job:  [id number]

## 2010-10-23 NOTE — Consult Note (Signed)
Phillip [name], Phillip [name]               ACCOUNT NO.:  [id number]   MEDICAL RECORD NO.:  [id number]          PATIENT TYPE:  INP   LOCATION:  0098                         FACILITY:  [facility]   PHYSICIAN:  Bevelyn Buckles. Bensimhon, MDDATE OF BIRTH:  03-Jun-1922   DATE OF CONSULTATION:  11/19/2007  DATE OF DISCHARGE:                                 CONSULTATION   REQUESTING PHYSICIAN:  John L. Rendall, M.D., orthopedic surgery.   PRIMARY CARDIOLOGIST:  Learta Codding, MD,FACC   REASON FOR CONSULTATION:  Chronic atrial fibrillation.   HISTORY OF PRESENT ILLNESS:  Mr. Valone is an 77 year old male with a  history of chronic atrial fibrillation complicated by tachybrady  syndrome, diabetes, hypertension and macular degeneration which has led  to legal blindness.  He was transferred down from [facility] due  to right hip and femur fracture after falling off a ladder while trying  to work on his gutters.   He is now in the recovery room.  He is confused.  He is unable to  provide any significant history.  His ventricular rates have been  ranging from the 70 to 120 range.  It is 70 when he is calm and then  obviously 120 when he is stirring.   He has normal ejection fraction with a previous negative Myoview.   Apparently in the past he has had problems with tachybrady syndrome.  He  was previously on diltiazem and digoxin.  On his monitor he had 2 to 2.5  second pauses so his digoxin was stopped and he was maintained just on  diltiazem.   REVIEW OF SYSTEMS:  Is currently unavailable due to his confusion.   PAST MEDICAL HISTORY:  1. Chronic atrial fibrillation.      a.     Complicated by tachybrady syndrome with 2 to 2.5 second       pauses on the monitor requiring him to stop his digoxin.  2. Dementia.  3. Hypertension.  4. Macular degeneration with legal blindness.  5. Diabetes.  6. History of osteoarthritis.      a.     Status post right total hip replacement in the past, status  post revision.   HOME MEDICATIONS:  1. Diltiazem ER 120 a day.  2. Glipizide XL 5 mg a day.  3. Exelon 3 mg b.i.d.  4. Coumadin 2.5 mg a day.   ALLERGIES:  He has no known drug allergies.   SOCIAL HISTORY:  He is married, lives with his wife.  No current smoking  or alcohol.   FAMILY HISTORY:  Is unobtainable.   PHYSICAL EXAM:  GENERAL:  He is sitting in the recovery room.  He is  somewhat agitated.  He is unable to provide any history.  He is hard-of-  hearing.  VITAL SIGNS:  Blood pressure is 130/51, heart rates are ranging from 70  to 120, he is satting 98% on 2 liters.  HEENT:  Normal.  NECK:  Supple.  There is no obvious JVD.  Carotids are 2+ bilaterally  without bruits.  There is no lymphadenopathy or thyromegaly.  CARDIAC:  PMI  is nondisplaced.  He is irregular with no obvious murmurs.  LUNGS:  Clear.  ABDOMEN:  Soft, nontender, nondistended.  There is no hepatosplenomegaly  and no bruits or masses appreciated.  EXTREMITIES:  Are warm.  There is no cyanosis, clubbing or edema.  He  has surgical wound that is dressed on his right hip and thigh.  NEUROLOGICAL:  He is disoriented, otherwise nonfocal.   Sodium is 137, potassium 4.5, BUN 16, creatinine 1.0.  CBC - white count  is 7.1, hemoglobin 14.4, platelet count of 128, INR is 1.2.  Hemoglobin  A1c is 5.9.  EKG preoperatively shows atrial fibrillation with rapid  ventricular response at a rate of 107.  There is LVH with repole.   ASSESSMENT:  1. Chronic atrial fibrillation with borderline elevated rates.  2. Tachybrady syndrome as described above.  3. Recent right femur and hip fracture status post repair today.  4. Normal LV (left ventricle) function.  5. Diabetes.  6. Dementia.   PLAN:  Agree with watching him on step-down.  I will give him 2.5 mg of  IV Lopressor now and continue his diltiazem.  We can use p.r.n.  Lopressor as needed to keep his heart rates under 100.  I suspect this  may be somewhat difficult  with his agitation.  We also need to do it  carefully to make sure he does not develop any bradycardia.  I would  resume Coumadin when appropriate from a surgical standpoint.  We will  follow with you.  We appreciate the consult.      Bevelyn Buckles. Bensimhon, MD  Electronically Signed     DRB/MEDQ  D:  11/19/2007  T:  11/19/2007  Job:  [id number]

## 2010-10-23 NOTE — Assessment & Plan Note (Signed)
[facility]                          [facility]ICE NOTE   Phillip [name], Phillip [name]                        MRN:          [id number]  DATE:06/08/2007                            DOB:          06/26/1921    PRIMARY CARDIOLOGIST:  Learta Codding, MD.   REASON FOR VISIT:  Scheduled clinic follow-up.   Since last seen here in the clinic on November 17, the patient reports  increased level of energy.  He is tolerating the Coumadin, which we just  started him on, with no associated bleeding complications.  He has noted  some mild constipation, and this appears to be attributed to his cutting  back on green, fibrous vegetables.   Clinically, he denies any exertional chest pain, dyspnea, or tachy  palpitations.  He also denies any presyncope.   Of note, we stopped both digoxin and aspirin at time of last clinic  visit, given the aforementioned reasons.   Electrocardiogram in our office reveals atrial fibrillation at 89 BPM.   CURRENT MEDICATIONS:  1. Coumadin 5 mg as directed.  2. Diltiazem 120 ng daily,  3. Glipizide XL 10 mg daily.   PHYSICAL EXAMINATION:  Blood pressure 134/84, pulse 84, irregular,  weight 172.6.  GENERAL:  An 77 year old male sitting upright in no distress.  HEENT:  Normocephalic, atraumatic.  NECK:  Palpable bilateral carotid pulses without bruits; no JVD.  LUNGS:  Clear to auscultation in all fields.  HEART:  Irregularly regular (S1, S2). No significant murmurs.  ABDOMEN:  Benign.  EXTREMITIES:  No significant edema.  NEUROLOGIC:  No focal deficit.   IMPRESSION:  1. Permanent atrial fibrillation.      a.     Rate controlled on long acting diltiazem.      b.     CHAD2 score:  3.  2. Preserved left ventricular function.      a.     Moderate left ventricular hypertrophy.  3. Hypertension.  4. Type 2 diabetes mellitus.  5. Macular degeneration.   PLAN:  1. Continue current medication regimen.  2. Schedule return clinic  follow-up with myself and Dr. Andee Lineman in 3      months.      Gene Serpe, PA-C  Electronically Signed      Learta Codding, MD,FACC  Electronically Signed   GS/MedQ  DD: 06/08/2007  DT: 06/08/2007  Job #: [id number]   cc:   Doreen Beam

## 2010-10-23 NOTE — Assessment & Plan Note (Signed)
[facility]                          [facility] NOTE   Phillip [name], Phillip [name]                        MRN:          [id number]  DATE:04/27/2007                            DOB:          1922-03-04    PRIMARY CARDIOLOGIST:  Dr. Lewayne Bunting.   REASON FOR VISIT:  Scheduled clinic followup.  Please refer to my note  of October 14, for full details.   The patient returns to the office after recent evaluation for possible  underlying ischemic heart disease.  He had an adenosine stress  Cardiolite on October 22, reviewed by Dr. Andee Lineman, which revealed no  definite evidence of ischemia.  There was, however, a reversible defect  most likely secondary to diaphragmatic attenuation.  The formal report,  however, did not represent this accurately secondary to a typographical  error.  This was also compared with a recent 2D echocardiogram,  revealing moderate eccentric LVH with normal left ventricular function  (EF 55-60%) and no wall motion abnormalities.  There was moderate  dilatation of the left atrium, however.   Clinically, the patient feels stronger but still has some chronic  exertional dyspnea.  He denies any tachy palpitations, chest pain, or  pre-syncope/syncope.   Electrocardiogram today reveals atrial fibrillation at 55 beats per  minute with normal axis and persistent, nonspecific inferolateral ST  abnormalities.   CURRENT MEDICATIONS:  1. Coated aspirin 325 daily.  2. Digoxin 0.125 daily.  3. Glipizide XL 10 daily.  4. Diltiazem 120 daily.   PHYSICAL EXAMINATION:  Blood pressure 155/76, pulse 66 and regular,  weight 168.8.  GENERAL:  An 77 year old male sitting upright in no distress.  HEENT:  Normocephalic, atraumatic.  NECK:  Palpable bilateral carotid pulses without bruits; no JVD.  LUNGS:  Clear to auscultation in all fields.  HEART:  Irregularly irregular (S1, S2).  No significant murmurs.  ABDOMEN:  Benign.  EXTREMITIES:   Trace pedal edema.  NEURO:  No focal deficits.   IMPRESSION:  1. Permanent atrial fibrillation.      a.     CHAD2 score:  III.  2. Preserved left ventricular function.      a.     Moderate left ventricular hypertrophy.  3. History of hypertension.  4. Type 2 diabetes mellitus.  5. Macular degeneration.   PLAN:  1. Following review with Dr. Andee Lineman, recommendation is to proceed with      long-term Coumadin anticoagulation, as previously considered, in      light of the patient's increased risk of stroke.  Moreover, he is      still quite active and suggests no evidence of gait instability or      recent falls.  He also is quite agreeable with this plan.  We will,      therefore, initiate Coumadin anticoagulation starting this evening      at 5 mg daily.  He will be enrolled in the Coumadin Clinic, and we      will have him return in 48 hours for a followup Protime.  2. Discontinue aspirin, given that the patient will be started  on      Coumadin.  Moreover, he has no documented evidence of coronary      artery disease.  3. Discontinue digoxin, particularly in light of the current slow      ventricular response and known normal left ventricular function.      Moreover, his recent CardioNet monitor did document pauses of 2-2.3      seconds in duration.  4. Schedule return clinic followup with myself and Dr. Andee Lineman in 1      month.      Gene Serpe, PA-C  Electronically Signed      Learta Codding, MD,FACC  Electronically Signed   GS/MedQ  DD: 04/27/2007  DT: 04/28/2007  Job #: [id number]   cc:   Doreen Beam

## 2010-10-23 NOTE — Assessment & Plan Note (Signed)
[facility]                          [facility]   Phillip [name], Phillip [name]                        MRN:          [id number]  DATE:10/28/2008                            DOB:          May 16, 1922    REFERRING PHYSICIAN:  Erasmo Downer, MD   HISTORY OF PRESENT ILLNESS:  The patient is an 77 year old male with  history of permanent atrial fibrillation documented several years ago.  The patient has normal LV function.  The patient had an myocardial  perfusion study done in 2008 which showed a nonreversible defect  consistent with inferior wall motion with myocardial infarction,  although an echocardiogram showed no wall motion abnormalities.  The  patient was last seen on September 27, 2008.  His main complaint was that he  had symptoms of weakness, dyspnea, and fatigue.  We placed a 48-hour  Holter monitor, and it appeared that the patient still was not quite  rate controlled.  His heart rates at peak went up to 166 beats per  minute, and his average heart rate was 85 beats per minute.  He had no  significant pauses, although CardioNet done 2 years ago did show a tachy-  brady syndrome.  Initially, we increased the patient's Cardizem, and I  did not feel he needed an ischemia workup.   The plan was to consider pacing with medical therapy for rate control if  he had significant tachy-brady syndrome.   The patient states that overall he is doing well.  He still has episodes  of rapid heart rate and feels his heart is all over the place.   MEDICATIONS:  1. Warfarin as directed.  2. Exelon 3 mg p.o. b.i.d.  3. Glipizide XL 5 mg p.o. daily.  4. diltiazem 240 mg p.o. daily.   PHYSICAL EXAMINATION:  VITAL SIGNS:  Blood pressure 138/80, heart rate  62 beats per minute, weight 170 pounds.  GENERAL:  Well-nourished white male in no apparent distress.  HEENT:  Pupils isochoric.  Conjunctivae clear.  NECK:  Supple.  Normal carotid upstroke.  No carotid  bruits.  LUNGS:  Clear breath sounds bilaterally.  HEART:  Irregular rate and rhythm.  Normal S1 and S2.  Soft systolic  murmur, approximately 2/6.  ABDOMEN:  Soft.  EXTREMITIES:  No cyanosis, clubbing, or edema.  NEUROLOGIC:  The patient is alert, oriented, and grossly nonfocal.   PROBLEM LIST:  1. Atrial fibrillation rule out tachycardia-bradycardia syndrome.  2. Rate uncontrolled by 48-hour Holter monitor.  3. Fatigue, weakness, possibly secondary to atrial fibrillation.      a.     Rule out ischemic heart disease.  4. Diabetes mellitus.  5. Left ventricular hypertrophy and significant left atrial      enlargement.   PLAN:  1. Given the fact that the patient has significant left atrial      enlargement and his atrial fibrillation is long-standing, I do      think a rhythm control strategy is unlikely to be successful.  2. Given the fact that the patient had no significant pause by his 48-  hour Holter monitor, we will initially increase his Cardizem to 360      mg p.o. daily particularly as we have documented heart rates up to      166 beats per minute.  We will see how the patient responds to      this.  I will also refer the patient to EP.  He may need to be re-      Holtered on his higher dose of Cardizem to see if there is any      significant pauses.  Certainly, if he develops more fatigue,      weakness, and/or presyncope associated with pauses, the patient      will require in addition pacing.  I discussed all the options with      the patient, his understanding of the current treatment strategy.     Learta Codding, MD,FACC  Electronically Signed    GED/MedQ  DD: 10/30/2008  DT: 10/31/2008  Job #: [id number]   cc:   Erasmo Downer, MD

## 2010-10-23 NOTE — Discharge Summary (Signed)
NAME:  ARRION, BROADDUS               ACCOUNT NO.:  [id number]   MEDICAL RECORD NO.:  [id number]          PATIENT TYPE:  INP   LOCATION:  1411                         FACILITY:  [facility]   PHYSICIAN:  John L. Rendall, M.D.  DATE OF BIRTH:  Apr 26, 1922   DATE OF ADMISSION:  11/16/2007  DATE OF DISCHARGE:  11/24/2007                               DISCHARGE SUMMARY   ADMISSION DIAGNOSES:  1. Right periprosthetic femur fracture.  2. Chronic atrial fibrillation, on Coumadin.  3. Macular degeneration/legally blind.  4. Diabetes mellitus.  5. Hypertension.  6. Mild dementia.   DISCHARGE DIAGNOSES:  1. Periprosthetic right femur fracture, status post open reduction,      internal fixation.  2. Acute blood loss anemia, secondary to surgery.  3. Difficulty with Foley placement, probably due to enlarged prostate.  4. Mild postoperative confusion, now resolved.  5. Chronic atrial fibrillation on Coumadin.  6. Macular degeneration/legally blind.  7. Diabetes mellitus.  8. Hypertension.  9. Mild dementia.   SURGICAL PROCEDURE:  On November 19, 2007, Mr. Mcclish underwent an open  reduction, internal fixation of his right periprosthetic femur fracture  by Dr. Jonny Ruiz L. Rendall, assisted by Legrand Pitts. Duffy, P.A.-C.  Complications:  None.   CONSULTATIONS:  1. Urology consultation due to difficulty with Foley placement on November 19, 2007.  2. Pharmacy consultation for Coumadin therapy on November 19, 2007.  3. [facility] consultation on November 19, 2007.  4. Occupation and physical therapy consultation on November 20, 2007.  5. In addition, an R.N. and case Curator.  6. Social Work consultation on November 23, 2007.   HISTORY OF PRESENT ILLNESS:  This 77 year old white male patient was  transferred to our care from [facility] by Dr. Erasmo Downer due to the patient having a right periprosthetic femur fracture.  He had been up on a ladder about four feet above the ground on  the day  before transfer, when he lost his balance and fell.  He thinks he landed  on his right side, but did not hit his head.  He complained of immediate  right leg pain and inability to stand.  He was able to crawl into the  house and grab some crutches and drive himself to his neighbor's, who  then drove him to [facility]  He was seen at that time and was  found to have a periprosthetic fracture of his right femur, and Dr.  Linna Darner called [place] on November 16, 2007, to see if we would accept transfer of  the patient.  He was transferred to [facility] that day.   HOSPITAL COURSE:  Upon arrival, his INR was too elevated for surgery, so  he was treated just with conservative measures until his INR dropped to  an acceptable level for surgery.  He was 1.4 on June 10th and was 1.2 on  June 11th.  At that time he was able to undergo surgery and he tolerated  that well.  A Foley was difficult to place and a urology consultation  was obtained  postoperatively and there was limited urine from the Foley  and blood noted.  The urologist found that the balloon had been inflated  in the prosthetic fossa.  It was readjusted and kept for several days.  He also had some acute delirium after surgery and it was felt that he  would be better treated in step-down, and was transferred to the step-  down unit.  He also had some difficulty with his pulse rate being  maintained in a normal range, so [facility] was consulted and  they followed him during his hospitalization.   On postop day two the confusion had resolved.  He was afebrile and his  vital signs stable.  Hemoglobin 11, hematocrit 32.4.  He had some  swelling noted on the right side, but the Ace wrap was removed.  His  platelets were a bit low at 84, so a HIT screen was obtained which was  negative.  He was back on his Coumadin and Lovenox until the Coumadin  was therapeutic.  Case management began work on skilled placement, due   to his wife being hospitalized for a knee replacement herself the day  after his surgery.   He continued to make good progress over the next several days.  The  hemoglobin and hematocrit remained stable.  Cardiology was able to  adjust his medications appropriately and his heart rate improved.  He  was able to be transferred out of step-down.  Cardiology in fact signed  off on him on November 22, 2007.  On November 23, 2007, he remained afebrile.  His vital signs were stable.  PT was 25.5, INR 2.2.  Mepilex was intact  to the right hip and thigh with minimal drainage.  He was tolerating  physical therapy well with touchdown weightbearing.  Plans were made for  transfer to SNF in the next several days.   On November 24, 2007, he remains afebrile.  Hemoglobin 9.9, hematocrit 29.2,  INR 2.2.  It is felt that he is ready for transfer to the skilled  facility and hopefully will be transferred there later today.   DISCHARGE INSTRUCTIONS:   DIET:  He is to remain on a diabetic diet, medium carbohydrate-modified.   DISCHARGE MEDICATIONS:  1. Cardizem CD 120 mg p.o. q.a.m.  2. Glucotrol XL 5 mg p.o. q.a.m.  3. Exelon 3 mg p.o. twice daily.  4. Protonix 40 mg p.o. q.a.m.  5. Coumadin, to be taken at 6 p.m. each day with the dose to be      adjusted by the pharmacy.  His last dose of Coumadin on November 23, 2007, was 2 mg.  6. Colace 100 mg p.o. twice daily.  7. Senokot one tab p.o. twice daily.  8. Sliding scale insulin q.a.c. with no h.s. coverage.  For a CBG of      70-100, 0 units; 101-150, 1 unit; 151-200, 2 units; 201-250, 3      units; 251-300, 5 units; 301-350, 7 units; 351-400, 9 units.      Greater than 400, he needs stat labs and to notify the M.D.  9. Norco 5/325 mg, one to two p.o. q.4h. p.r.n. pain.  10.Robaxin 500 mg, one to two tab p.o. q.6h. p.r.n. spasms.  11.Enema of choice, laxative of choice p.r.n. constipation.  12.Benadryl 25 mg p.o. q.6h. p.r.n. itching.  13.Tylenol one to  two tab p.o. q.4h. p.r.n. pain.  14.Reglan 10 mg p.o. q.8h. p.r.n. nausea.   ACTIVITY:  He can be out of bed with physical therapy, touchdown  weightbearing on the right leg only.  This needs to be maintained for  probably a good four to six weeks.  He is to have PT and OT per rehab  protocols.   WOUND CARE:  Ventex dressing which is on his right hip at this time.  Can remain another three to four days.  At that time it can be removed.  Incision cleansed with Betadine and a dry dressing applied.  If there is  no drainage from the wound, it can be left open to air.  Please notify  Dr. Priscille Kluver if temperature greater than 101.5 degrees, chills, pain  unrelieved by pain medications or foul-smelling drainage from the wound.   FOLLOWUP:  He needs to follow up with Dr. Priscille Kluver in our [facility] on  Monday, November 30, 2007, and you need to call [phone] for that  appointment.  He should follow up with [facility] per their  office, and with Dr. Linna Darner, his medical doctor, per his office.   LABORATORY DATA:  Chest x-ray done on November 15, 2007, at [facility]  showed cardiac enlargement without acute cardiopulmonary process.  X-ray  taken at [facility] on November 15, 2007, showed a right proximal femoral  diaphyseal fracture at the level of the distal femoral prosthesis.  Spot  films done on November 19, 2007, showed an ORIF of the periprosthetic  fracture of the right femur with plate and screws and cerclage wires,  with good position.   Hemoglobin A1c on November 18, 2007, was 5.9%.  A HIT screen on November 20, 2007, was negative.  Hemoglobin and hematocrit ranged from 14.4 and 41.7  on November 19, 2007, to a low of 9.4 and 27.5 on November 22, 2007, to 9.9 and  29.2 on November 24, 2007.  Platelets went from 130 on November 18, 2007, to a  low of 79 on June 13th, to 147 on June 16th.  White count has been  within normal limits.  Sodium dropped to a low of 134 on June 12th.  Glucose has ranged from 111 on June  10th to 87 on June 13th, to a high  of 169 on June 12th.  PT and INR have ranged from 17.4 and 1.4 on June  10th, to 15.3 and 1.2 on June 11th, to 25.3 and 2.2 on June 16th.  Calcium has ranged from 8.4 on June 10th, to 7.7 on June 12th, to 8.2 on  June 16th.  All other laboratory studies were within normal limits.      Legrand Pitts Duffy, P.A.      John L. Rendall, M.D.  Electronically Signed    KED/MEDQ  D:  11/24/2007  T:  11/24/2007  Job:  [id number]   cc:   Learta Codding, MD,FACC  [address]  [place], [place] [address]   Erasmo Downer, MD

## 2010-10-30 ENCOUNTER — Ambulatory Visit (INDEPENDENT_AMBULATORY_CARE_PROVIDER_SITE_OTHER): Payer: Medicare Other | Admitting: *Deleted

## 2010-10-30 DIAGNOSIS — Z7901 Long term (current) use of anticoagulants: Secondary | ICD-10-CM

## 2010-10-30 DIAGNOSIS — I4891 Unspecified atrial fibrillation: Secondary | ICD-10-CM

## 2010-11-27 ENCOUNTER — Ambulatory Visit (INDEPENDENT_AMBULATORY_CARE_PROVIDER_SITE_OTHER): Payer: Medicare Other | Admitting: *Deleted

## 2010-11-27 ENCOUNTER — Other Ambulatory Visit: Payer: Self-pay | Admitting: Cardiology

## 2010-11-27 ENCOUNTER — Encounter: Payer: Medicare Other | Admitting: *Deleted

## 2010-11-27 DIAGNOSIS — I4891 Unspecified atrial fibrillation: Secondary | ICD-10-CM

## 2010-11-27 DIAGNOSIS — Z7901 Long term (current) use of anticoagulants: Secondary | ICD-10-CM

## 2010-11-27 LAB — POCT INR: INR: 2.2

## 2010-12-25 ENCOUNTER — Ambulatory Visit (INDEPENDENT_AMBULATORY_CARE_PROVIDER_SITE_OTHER): Payer: Medicare Other | Admitting: *Deleted

## 2010-12-25 DIAGNOSIS — Z7901 Long term (current) use of anticoagulants: Secondary | ICD-10-CM

## 2010-12-25 DIAGNOSIS — I4891 Unspecified atrial fibrillation: Secondary | ICD-10-CM

## 2010-12-25 LAB — POCT INR: INR: 3.5

## 2011-01-08 ENCOUNTER — Ambulatory Visit (INDEPENDENT_AMBULATORY_CARE_PROVIDER_SITE_OTHER): Payer: Self-pay | Admitting: *Deleted

## 2011-01-08 ENCOUNTER — Encounter: Payer: Medicare Other | Admitting: *Deleted

## 2011-01-08 DIAGNOSIS — R0989 Other specified symptoms and signs involving the circulatory and respiratory systems: Secondary | ICD-10-CM

## 2011-01-08 DIAGNOSIS — Z7901 Long term (current) use of anticoagulants: Secondary | ICD-10-CM

## 2011-01-08 DIAGNOSIS — I4891 Unspecified atrial fibrillation: Secondary | ICD-10-CM

## 2011-01-08 LAB — POCT INR: INR: 3.9

## 2011-01-21 LAB — POCT INR: INR: 2.1

## 2011-01-22 ENCOUNTER — Ambulatory Visit (INDEPENDENT_AMBULATORY_CARE_PROVIDER_SITE_OTHER): Payer: Self-pay | Admitting: *Deleted

## 2011-01-22 DIAGNOSIS — R0989 Other specified symptoms and signs involving the circulatory and respiratory systems: Secondary | ICD-10-CM

## 2011-01-28 ENCOUNTER — Other Ambulatory Visit: Payer: Self-pay | Admitting: Cardiology

## 2011-02-04 LAB — PROTIME-INR: INR: 1.8 — AB (ref 0.9–1.1)

## 2011-02-05 ENCOUNTER — Ambulatory Visit (INDEPENDENT_AMBULATORY_CARE_PROVIDER_SITE_OTHER): Payer: Self-pay | Admitting: *Deleted

## 2011-02-05 DIAGNOSIS — R0989 Other specified symptoms and signs involving the circulatory and respiratory systems: Secondary | ICD-10-CM

## 2011-02-19 ENCOUNTER — Ambulatory Visit (INDEPENDENT_AMBULATORY_CARE_PROVIDER_SITE_OTHER): Payer: Self-pay | Admitting: *Deleted

## 2011-02-19 DIAGNOSIS — R0989 Other specified symptoms and signs involving the circulatory and respiratory systems: Secondary | ICD-10-CM

## 2011-03-07 LAB — BASIC METABOLIC PANEL
BUN: 13
BUN: 15
BUN: 16
BUN: 19
BUN: 17
CO2: 23
CO2: 25
CO2: 27
CO2: 26
Calcium: 7.7 — ABNORMAL LOW
Calcium: 7.8 — ABNORMAL LOW
Calcium: 8.4
Calcium: 8.7
Calcium: 8.2 — ABNORMAL LOW
Chloride: 107
Chloride: 107
Chloride: 105
Creatinine, Ser: 0.79
Creatinine, Ser: 0.86
Creatinine, Ser: 0.9
Creatinine, Ser: 0.93
Creatinine, Ser: 1
Creatinine, Ser: 0.88
GFR calc Af Amer: >60
GFR calc Af Amer: >60
GFR calc Af Amer: >60
GFR calc non Af Amer: >60
GFR calc non Af Amer: >60
GFR calc non Af Amer: >60
Glucose, Bld: 111 — ABNORMAL HIGH
Glucose, Bld: 125 — ABNORMAL HIGH
Glucose, Bld: 130 — ABNORMAL HIGH
Glucose, Bld: 87
Potassium: 4.1
Potassium: 4.2
Potassium: 4.9
Sodium: 134 — ABNORMAL LOW
Sodium: 139

## 2011-03-07 LAB — PROTIME-INR
INR: 1.2
INR: 1.4
INR: 1.8 — ABNORMAL HIGH
INR: 2.3 — ABNORMAL HIGH
INR: 2.2 — ABNORMAL HIGH
INR: 2.2 — ABNORMAL HIGH
Prothrombin Time: 15.3 — ABNORMAL HIGH
Prothrombin Time: 17.6 — ABNORMAL HIGH
Prothrombin Time: 22.1 — ABNORMAL HIGH
Prothrombin Time: 25.6 — ABNORMAL HIGH
Prothrombin Time: 25.3 — ABNORMAL HIGH

## 2011-03-07 LAB — CBC
HCT: 28 — ABNORMAL LOW
HCT: 32.4 — ABNORMAL LOW
HCT: 40.4
Hemoglobin: 13.5
MCHC: 33.4
MCHC: 33.9
MCHC: 34.2
MCHC: 34.5
MCHC: 33.9
MCV: 92
MCV: 92.2
MCV: 92.9
MCV: 93.4
MCV: 93.2
Platelets: 100 — ABNORMAL LOW
Platelets: 128 — ABNORMAL LOW
Platelets: 130 — ABNORMAL LOW
Platelets: 79 — ABNORMAL LOW
Platelets: 84 — ABNORMAL LOW
Platelets: 147 — ABNORMAL LOW
RBC: 4.32
RBC: 4.53
RDW: 15.5
RDW: 15.9 — ABNORMAL HIGH
RDW: 16 — ABNORMAL HIGH
RDW: 16.1 — ABNORMAL HIGH
WBC: 7.1
WBC: 8.2
WBC: 8.2
WBC: 9

## 2011-03-07 LAB — URINE CULTURE
Colony Count: 50000
Special Requests: NEGATIVE

## 2011-03-07 LAB — URINALYSIS, ROUTINE W REFLEX MICROSCOPIC
Ketones, ur: NEGATIVE
Protein, ur: NEGATIVE
Urobilinogen, UA: 0.2

## 2011-03-07 LAB — MAGNESIUM: Magnesium: 1.9

## 2011-03-07 LAB — HEPARIN ANTIBODY SCREEN: Heparin Antibody Screen: NEGATIVE

## 2011-03-07 LAB — PHOSPHORUS: Phosphorus: 2.7

## 2011-03-22 ENCOUNTER — Ambulatory Visit (INDEPENDENT_AMBULATORY_CARE_PROVIDER_SITE_OTHER): Payer: Self-pay | Admitting: *Deleted

## 2011-03-22 DIAGNOSIS — R0989 Other specified symptoms and signs involving the circulatory and respiratory systems: Secondary | ICD-10-CM

## 2011-04-12 ENCOUNTER — Ambulatory Visit (INDEPENDENT_AMBULATORY_CARE_PROVIDER_SITE_OTHER): Payer: Medicare Other | Admitting: *Deleted

## 2011-04-12 DIAGNOSIS — Z7901 Long term (current) use of anticoagulants: Secondary | ICD-10-CM

## 2011-04-12 DIAGNOSIS — I4891 Unspecified atrial fibrillation: Secondary | ICD-10-CM

## 2011-04-19 ENCOUNTER — Ambulatory Visit (INDEPENDENT_AMBULATORY_CARE_PROVIDER_SITE_OTHER): Payer: Medicare Other | Admitting: *Deleted

## 2011-04-19 DIAGNOSIS — Z7901 Long term (current) use of anticoagulants: Secondary | ICD-10-CM

## 2011-04-19 DIAGNOSIS — I4891 Unspecified atrial fibrillation: Secondary | ICD-10-CM

## 2011-04-25 ENCOUNTER — Other Ambulatory Visit: Payer: Self-pay | Admitting: Cardiology

## 2011-05-01 ENCOUNTER — Encounter: Payer: Self-pay | Admitting: *Deleted

## 2011-05-06 ENCOUNTER — Encounter: Payer: Self-pay | Admitting: Cardiology

## 2011-05-06 ENCOUNTER — Ambulatory Visit (INDEPENDENT_AMBULATORY_CARE_PROVIDER_SITE_OTHER): Payer: Medicare Other | Admitting: Cardiology

## 2011-05-06 VITALS — BP 138/82 | HR 76 | Ht 68.0 in | Wt 174.0 lb

## 2011-05-06 DIAGNOSIS — Z7901 Long term (current) use of anticoagulants: Secondary | ICD-10-CM

## 2011-05-06 DIAGNOSIS — I4891 Unspecified atrial fibrillation: Secondary | ICD-10-CM

## 2011-05-06 DIAGNOSIS — I38 Endocarditis, valve unspecified: Secondary | ICD-10-CM

## 2011-05-06 NOTE — Progress Notes (Signed)
CC: Routine followup  HPI:  Patient reports some chest congestion. No shortness of breath. Rhinitis. Only feels weeks when picking up something. He states when he goes dancing however he feels fine. Sometimes he feels is lacking strength. He reports no palpitations chest pain shortness of breath orthopnea PND. However in the office his heart rate is relatively low.     PMH: reviewed and listed in Problem List in Electronic Records (and see below) Past Medical History  Diagnosis Date  . Left ventricular hypertrophy   . Permanent atrial fibrillation   . Fatigue   . Diabetes mellitus   . Encounter for long-term (current) use of anticoagulants   . Hypertension   . Macular degeneration   . Hiatal hernia   . History of thrombocytopenia   . Dementia      Allergies/SH/FHX : available in Electronic Records for review  Medications: Current Outpatient Prescriptions  Medication Sig Dispense Refill  . diltiazem (CARDIZEM CD) 120 MG 24 hr capsule TAKE (1) CAPSULE ONCE DAILY  30 capsule  0  . GLIPIZIDE XL 5 MG 24 hr tablet Take 1 tablet by mouth Daily.      . meclizine (ANTIVERT) 25 MG tablet Take 25 mg by mouth 3 (three) times daily as needed.        . metFORMIN (GLUCOPHAGE) 500 MG tablet Take 1 tablet by mouth Twice daily.      Marland Kitchen oxazepam (SERAX) 15 MG capsule Take 15 mg by mouth at bedtime.       . predniSONE (DELTASONE) 5 MG tablet Take 1 tablet by mouth Daily.      . ranitidine (ZANTAC) 300 MG tablet Take 300 mg by mouth at bedtime.        . rivastigmine (EXELON) 6 MG capsule Take 1 capsule by mouth Twice daily.      Marland Kitchen warfarin (COUMADIN) 2.5 MG tablet TAKE AS DIRECTED  50 tablet  2    ROS: No nausea or vomiting. No fever or chills.No melena or hematochezia.No bleeding.No claudication  Physical Exam: BP 138/82  Pulse 76  Ht 5\' 8"  (1.727 m)  Wt 174 lb (78.926 kg)  BMI 26.46 kg/m2  SpO2 97% General: well- nourished. No distress HEENT: normal carotid upstroke. Normal JVP. No  thyromegaly Cardiac: RRR, NL S1/S2. No pathologic murmurs Lungs: clear BS bilaterally, no wheezing. Abdomen, soft, non- tender Extremities. No edema. Normal distal pulses Skin: warm and dry Physologic ; normal affect.    12lead ECG: not obtained  Limited bedside ECHO:N/A   Assessment and Plan

## 2011-05-06 NOTE — Assessment & Plan Note (Signed)
No complications with Coumadin.

## 2011-05-06 NOTE — Assessment & Plan Note (Signed)
Heart rate may be relatively low and it may be some chronotropic insufficiency particularly during activity. We will apply a 48 hour Holter monitor

## 2011-05-06 NOTE — Assessment & Plan Note (Signed)
No evidence of valvular heart disease by physical examination. No clear indication to obtain an echocardiogram at this point.

## 2011-05-06 NOTE — Patient Instructions (Signed)
   48 hour holter monitor If the results of your test are normal or stable, you will receive a letter.  If they are abnormal, the nurse will contact you by phone. Your physician wants you to follow up in: 6 months.  You will receive a reminder letter in the mail one-two months in advance.  If you don't receive a letter, please call our office to schedule the follow up appointment

## 2011-05-08 ENCOUNTER — Other Ambulatory Visit: Payer: Self-pay | Admitting: *Deleted

## 2011-05-08 DIAGNOSIS — I4891 Unspecified atrial fibrillation: Secondary | ICD-10-CM

## 2011-05-08 NOTE — Progress Notes (Signed)
Error

## 2011-05-10 ENCOUNTER — Ambulatory Visit (INDEPENDENT_AMBULATORY_CARE_PROVIDER_SITE_OTHER): Payer: Medicare Other | Admitting: *Deleted

## 2011-05-10 DIAGNOSIS — I4891 Unspecified atrial fibrillation: Secondary | ICD-10-CM

## 2011-05-10 DIAGNOSIS — Z7901 Long term (current) use of anticoagulants: Secondary | ICD-10-CM

## 2011-05-10 LAB — POCT INR: INR: 2

## 2011-05-15 ENCOUNTER — Encounter (INDEPENDENT_AMBULATORY_CARE_PROVIDER_SITE_OTHER): Payer: Medicare Other | Admitting: *Deleted

## 2011-05-15 DIAGNOSIS — I4891 Unspecified atrial fibrillation: Secondary | ICD-10-CM

## 2011-05-15 DIAGNOSIS — Z7901 Long term (current) use of anticoagulants: Secondary | ICD-10-CM

## 2011-05-27 ENCOUNTER — Other Ambulatory Visit: Payer: Self-pay | Admitting: Cardiology

## 2011-05-28 ENCOUNTER — Ambulatory Visit (INDEPENDENT_AMBULATORY_CARE_PROVIDER_SITE_OTHER): Payer: Medicare Other | Admitting: *Deleted

## 2011-05-28 DIAGNOSIS — I4891 Unspecified atrial fibrillation: Secondary | ICD-10-CM

## 2011-05-28 DIAGNOSIS — Z7901 Long term (current) use of anticoagulants: Secondary | ICD-10-CM

## 2011-05-28 LAB — POCT INR: INR: 1.7

## 2011-05-29 ENCOUNTER — Other Ambulatory Visit: Payer: Self-pay | Admitting: Cardiology

## 2011-06-05 ENCOUNTER — Telehealth: Payer: Self-pay | Admitting: *Deleted

## 2011-06-05 NOTE — Telephone Encounter (Signed)
Cardionet monitor reviewed by Dr. Andee Lineman - permanent Atrial Fib - rate controlled.  Wife notified of above.

## 2011-06-17 ENCOUNTER — Ambulatory Visit (INDEPENDENT_AMBULATORY_CARE_PROVIDER_SITE_OTHER): Payer: Self-pay | Admitting: *Deleted

## 2011-06-17 DIAGNOSIS — Z7901 Long term (current) use of anticoagulants: Secondary | ICD-10-CM

## 2011-06-17 DIAGNOSIS — R0989 Other specified symptoms and signs involving the circulatory and respiratory systems: Secondary | ICD-10-CM

## 2011-06-17 DIAGNOSIS — I4891 Unspecified atrial fibrillation: Secondary | ICD-10-CM

## 2011-06-17 LAB — PROTIME-INR: INR: 2.2 — AB (ref 0.9–1.1)

## 2011-06-25 ENCOUNTER — Encounter: Payer: Medicare Other | Admitting: *Deleted

## 2011-07-09 ENCOUNTER — Ambulatory Visit (INDEPENDENT_AMBULATORY_CARE_PROVIDER_SITE_OTHER): Payer: Medicare Other | Admitting: *Deleted

## 2011-07-09 DIAGNOSIS — Z7901 Long term (current) use of anticoagulants: Secondary | ICD-10-CM

## 2011-07-09 DIAGNOSIS — I4891 Unspecified atrial fibrillation: Secondary | ICD-10-CM

## 2011-07-09 LAB — POCT INR: INR: 1.6

## 2011-07-30 ENCOUNTER — Ambulatory Visit (INDEPENDENT_AMBULATORY_CARE_PROVIDER_SITE_OTHER): Payer: Medicare Other | Admitting: *Deleted

## 2011-07-30 DIAGNOSIS — Z7901 Long term (current) use of anticoagulants: Secondary | ICD-10-CM

## 2011-07-30 DIAGNOSIS — I4891 Unspecified atrial fibrillation: Secondary | ICD-10-CM

## 2011-07-30 LAB — POCT INR: INR: 3.8

## 2011-08-20 ENCOUNTER — Ambulatory Visit (INDEPENDENT_AMBULATORY_CARE_PROVIDER_SITE_OTHER): Payer: Medicare Other | Admitting: *Deleted

## 2011-08-20 DIAGNOSIS — I4891 Unspecified atrial fibrillation: Secondary | ICD-10-CM

## 2011-08-20 DIAGNOSIS — Z7901 Long term (current) use of anticoagulants: Secondary | ICD-10-CM

## 2011-08-20 LAB — POCT INR: INR: 1.8

## 2011-09-03 ENCOUNTER — Ambulatory Visit (INDEPENDENT_AMBULATORY_CARE_PROVIDER_SITE_OTHER): Payer: Medicare Other | Admitting: *Deleted

## 2011-09-03 DIAGNOSIS — I4891 Unspecified atrial fibrillation: Secondary | ICD-10-CM

## 2011-09-03 DIAGNOSIS — Z7901 Long term (current) use of anticoagulants: Secondary | ICD-10-CM

## 2011-09-03 LAB — POCT INR: INR: 2.6

## 2011-10-01 ENCOUNTER — Ambulatory Visit (INDEPENDENT_AMBULATORY_CARE_PROVIDER_SITE_OTHER): Payer: Medicare Other | Admitting: *Deleted

## 2011-10-01 DIAGNOSIS — I4891 Unspecified atrial fibrillation: Secondary | ICD-10-CM

## 2011-10-01 DIAGNOSIS — Z7901 Long term (current) use of anticoagulants: Secondary | ICD-10-CM

## 2011-10-01 LAB — POCT INR: INR: 2

## 2011-10-29 ENCOUNTER — Ambulatory Visit (INDEPENDENT_AMBULATORY_CARE_PROVIDER_SITE_OTHER): Payer: Medicare Other | Admitting: *Deleted

## 2011-10-29 DIAGNOSIS — Z7901 Long term (current) use of anticoagulants: Secondary | ICD-10-CM

## 2011-10-29 DIAGNOSIS — I4891 Unspecified atrial fibrillation: Secondary | ICD-10-CM

## 2011-10-29 LAB — POCT INR: INR: 2.5

## 2011-11-08 ENCOUNTER — Other Ambulatory Visit: Payer: Self-pay | Admitting: Cardiology

## 2011-11-11 ENCOUNTER — Ambulatory Visit (INDEPENDENT_AMBULATORY_CARE_PROVIDER_SITE_OTHER): Payer: Medicare Other | Admitting: Cardiology

## 2011-11-11 ENCOUNTER — Encounter: Payer: Self-pay | Admitting: Cardiology

## 2011-11-11 VITALS — BP 122/74 | HR 75 | Ht 68.0 in | Wt 170.0 lb

## 2011-11-11 DIAGNOSIS — I517 Cardiomegaly: Secondary | ICD-10-CM

## 2011-11-11 DIAGNOSIS — Z7901 Long term (current) use of anticoagulants: Secondary | ICD-10-CM

## 2011-11-11 DIAGNOSIS — I38 Endocarditis, valve unspecified: Secondary | ICD-10-CM

## 2011-11-11 DIAGNOSIS — R5381 Other malaise: Secondary | ICD-10-CM

## 2011-11-11 DIAGNOSIS — I10 Essential (primary) hypertension: Secondary | ICD-10-CM

## 2011-11-11 DIAGNOSIS — R5383 Other fatigue: Secondary | ICD-10-CM

## 2011-11-11 DIAGNOSIS — I4891 Unspecified atrial fibrillation: Secondary | ICD-10-CM

## 2011-11-11 NOTE — Progress Notes (Signed)
Phillip Bottoms, MD, [facility] ABIM Board Certified in Adult Cardiovascular Medicine,Internal Medicine and Critical Care Medicine    CC: Followup patient with atrial fibrillation and  HPI:  Patient is doing well. He reports no palpitations orthopnea PND. He denies any chest pain. He has a history of left ventricle hypertrophy in permanent atrial fibrillation. Heart rate remains controlled. The patient stated he has some difficulty picking up weight of 50 pounds. However he still goes dancing despite his macular degeneration and reports no decrease in exercise tolerance.   PMH: reviewed and listed in Problem List in Electronic Records (and see below) Past Medical History  Diagnosis Date  . Left ventricular hypertrophy   . Permanent atrial fibrillation   . Fatigue   . Diabetes mellitus   . Encounter for long-term (current) use of anticoagulants   . Hypertension   . Macular degeneration   . Hiatal hernia   . History of thrombocytopenia   . Dementia    Past Surgical History  Procedure Date  . Cholecystectomy   . Prostate surgery   . Hip surgery     ,LEFT    Allergies/SH/FHX : available in Electronic Records for review  No Known Allergies History   Social History  . Marital Status: Married    Spouse Name: N/A    Number of Children: N/A  . Years of Education: N/A   Occupational History  . Not on file.   Social History Main Topics  . Smoking status: Never Smoker   . Smokeless tobacco: Never Used  . Alcohol Use: No     use to drink 50 years ago  . Drug Use: Not on file  . Sexually Active: Not on file   Other Topics Concern  . Not on file   Social History Narrative  . No narrative on file   Family History  Problem Relation Age of Onset  . Stroke Father   . Cancer Brother   . Coronary artery disease Sister     Medications: Current Outpatient Prescriptions  Medication Sig Dispense Refill  . diltiazem (CARDIZEM CD) 120 MG 24 hr capsule TAKE (1) CAPSULE ONCE  DAILY  30 capsule  6  . GLIPIZIDE XL 5 MG 24 hr tablet Take 1 tablet by mouth Daily.      . meclizine (ANTIVERT) 25 MG tablet Take 25 mg by mouth 3 (three) times daily as needed.        . meloxicam (MOBIC) 15 MG tablet Take 1 tablet by mouth Daily.      . metFORMIN (GLUCOPHAGE) 500 MG tablet Take 1 tablet by mouth Twice daily.      Marland Kitchen oxazepam (SERAX) 15 MG capsule Take 15 mg by mouth at bedtime.       . predniSONE (DELTASONE) 5 MG tablet Take 1 tablet by mouth Daily.      . ranitidine (ZANTAC) 300 MG tablet Take 300 mg by mouth at bedtime.        . rivastigmine (EXELON) 3 MG capsule Take 3 mg by mouth 2 (two) times daily.      Marland Kitchen warfarin (COUMADIN) 2.5 MG tablet TAKE AS DIRECTED  50 tablet  2    ROS: No nausea or vomiting. No fever or chills.No melena or hematochezia.No bleeding.No claudication  Physical Exam: BP 122/74  Pulse 75  Ht 5\' 8"  (1.727 m)  Wt 170 lb (77.111 kg)  BMI 25.85 kg/m2 General: Well-nourished white male in no distress Neck: Normal carotid upstroke no carotid bruits. No  thyromegaly nonnodular thyroid Lungs: Clear breath sounds bilaterally. No wheezing Cardiac: Irregular rate and rhythm with normal S1-S2 no murmurs or gallops Vascular: No edema. Skin: Warm and dry Physcologic: Normal affect  12lead ECG: Single lead EKG demonstrates rate controlled atrial fibrillation Limited bedside ECHO:N/A No images are attached to the encounter.   Assessment and Plan  VENTRICULAR HYPERTROPHY, LEFT No shortness of breath stable. Continue current medical regimen  ATRIAL FIBRILLATION Patient remains in permanent atrial fibrillation. However he reports no palpitations. I checked a single lead EKG today and his rate is well controlled and 71 beats per minute.  VALVULAR HEART DISEASE No significant valvular disease by physical examination. I told the patient that we would do a bedside echocardiogram during his next visit in 6 months.  ESSENTIAL HYPERTENSION, BENIGN Blood  pressure well controlled and no further adjustments needed  FATIGUE / MALAISE Patient states has difficulty picking up 50 pounds but I told him that this is not unusual for his age. He still goes dancing despite his macular degeneration and really does not complain of any decreased exercise tolerance  Encounter for long-term (current) use of anticoagulants Tolerating Coumadin very well. No falls despite his decreased vision. 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    Patient Active Problem List  Diagnoses  . DM  . ESSENTIAL HYPERTENSION, BENIGN  . VALVULAR HEART DISEASE  . ATRIAL FIBRILLATION  . VENTRICULAR HYPERTROPHY, LEFT  . FATIGUE / MALAISE  . Encounter for long-term (current) use of anticoagulants        '

## 2011-11-11 NOTE — Assessment & Plan Note (Signed)
Patient states has difficulty picking up 50 pounds but I told him that this is not unusual for his age. He still goes dancing despite his macular degeneration and really does not complain of any decreased exercise tolerance

## 2011-11-11 NOTE — Assessment & Plan Note (Signed)
Blood pressure well controlled and no further adjustments needed

## 2011-11-11 NOTE — Patient Instructions (Signed)
Continue all current medications. Your physician wants you to follow up in: 6 months.  You will receive a reminder letter in the mail one-two months in advance.  If you don't receive a letter, please call our office to schedule the follow up appointment   

## 2011-11-11 NOTE — Assessment & Plan Note (Signed)
Patient remains in permanent atrial fibrillation. However he reports no palpitations. I checked a single lead EKG today and his rate is well controlled and 71 beats per minute.

## 2011-11-11 NOTE — Assessment & Plan Note (Signed)
No shortness of breath stable. Continue current medical regimen

## 2011-11-11 NOTE — Assessment & Plan Note (Signed)
No significant valvular disease by physical examination. I told the patient that we would do a bedside echocardiogram during his next visit in 6 months.

## 2011-11-11 NOTE — Assessment & Plan Note (Signed)
Tolerating Coumadin very well. No falls despite his decreased vision. 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

## 2011-12-03 ENCOUNTER — Ambulatory Visit (INDEPENDENT_AMBULATORY_CARE_PROVIDER_SITE_OTHER): Payer: Medicare Other | Admitting: *Deleted

## 2011-12-03 DIAGNOSIS — I4891 Unspecified atrial fibrillation: Secondary | ICD-10-CM

## 2011-12-03 DIAGNOSIS — Z7901 Long term (current) use of anticoagulants: Secondary | ICD-10-CM

## 2011-12-20 ENCOUNTER — Other Ambulatory Visit: Payer: Self-pay | Admitting: Cardiology

## 2012-01-14 ENCOUNTER — Ambulatory Visit (INDEPENDENT_AMBULATORY_CARE_PROVIDER_SITE_OTHER): Payer: Medicare Other | Admitting: *Deleted

## 2012-01-14 DIAGNOSIS — Z7901 Long term (current) use of anticoagulants: Secondary | ICD-10-CM

## 2012-01-14 DIAGNOSIS — I4891 Unspecified atrial fibrillation: Secondary | ICD-10-CM

## 2012-02-25 ENCOUNTER — Ambulatory Visit (INDEPENDENT_AMBULATORY_CARE_PROVIDER_SITE_OTHER): Payer: Medicare Other | Admitting: *Deleted

## 2012-02-25 DIAGNOSIS — Z7901 Long term (current) use of anticoagulants: Secondary | ICD-10-CM

## 2012-02-25 DIAGNOSIS — I4891 Unspecified atrial fibrillation: Secondary | ICD-10-CM

## 2012-03-24 ENCOUNTER — Ambulatory Visit (INDEPENDENT_AMBULATORY_CARE_PROVIDER_SITE_OTHER): Payer: Medicare Other | Admitting: *Deleted

## 2012-03-24 DIAGNOSIS — Z7901 Long term (current) use of anticoagulants: Secondary | ICD-10-CM

## 2012-03-24 DIAGNOSIS — I4891 Unspecified atrial fibrillation: Secondary | ICD-10-CM

## 2012-03-24 LAB — POCT INR: INR: 2.6

## 2012-04-06 ENCOUNTER — Other Ambulatory Visit: Payer: Self-pay | Admitting: Cardiology

## 2012-05-05 ENCOUNTER — Ambulatory Visit (INDEPENDENT_AMBULATORY_CARE_PROVIDER_SITE_OTHER): Payer: Medicare Other | Admitting: *Deleted

## 2012-05-05 DIAGNOSIS — I4891 Unspecified atrial fibrillation: Secondary | ICD-10-CM

## 2012-05-05 DIAGNOSIS — Z7901 Long term (current) use of anticoagulants: Secondary | ICD-10-CM

## 2012-06-05 ENCOUNTER — Other Ambulatory Visit: Payer: Self-pay | Admitting: Physician Assistant

## 2012-06-09 ENCOUNTER — Ambulatory Visit (INDEPENDENT_AMBULATORY_CARE_PROVIDER_SITE_OTHER): Payer: Medicare Other | Admitting: *Deleted

## 2012-06-09 DIAGNOSIS — I4891 Unspecified atrial fibrillation: Secondary | ICD-10-CM

## 2012-06-09 DIAGNOSIS — Z7901 Long term (current) use of anticoagulants: Secondary | ICD-10-CM

## 2012-06-09 LAB — POCT INR: INR: 2.8

## 2012-06-19 ENCOUNTER — Other Ambulatory Visit: Payer: Self-pay | Admitting: Cardiology

## 2012-06-19 MED ORDER — DILTIAZEM HCL ER COATED BEADS 120 MG PO CP24: 120.0000 mg | ORAL_CAPSULE | Freq: Every day | ORAL | Status: DC

## 2012-06-30 ENCOUNTER — Other Ambulatory Visit: Payer: Self-pay | Admitting: *Deleted

## 2012-06-30 DIAGNOSIS — R269 Unspecified abnormalities of gait and mobility: Secondary | ICD-10-CM

## 2012-06-30 DIAGNOSIS — I6529 Occlusion and stenosis of unspecified carotid artery: Secondary | ICD-10-CM

## 2012-07-16 ENCOUNTER — Encounter: Payer: Self-pay | Admitting: Vascular Surgery

## 2012-07-17 ENCOUNTER — Other Ambulatory Visit: Payer: Self-pay | Admitting: *Deleted

## 2012-07-17 ENCOUNTER — Other Ambulatory Visit (INDEPENDENT_AMBULATORY_CARE_PROVIDER_SITE_OTHER): Payer: Medicare Other | Admitting: *Deleted

## 2012-07-17 ENCOUNTER — Ambulatory Visit (INDEPENDENT_AMBULATORY_CARE_PROVIDER_SITE_OTHER): Payer: Medicare Other | Admitting: *Deleted

## 2012-07-17 ENCOUNTER — Ambulatory Visit (INDEPENDENT_AMBULATORY_CARE_PROVIDER_SITE_OTHER): Payer: Medicare Other | Admitting: Vascular Surgery

## 2012-07-17 ENCOUNTER — Encounter: Payer: Self-pay | Admitting: Vascular Surgery

## 2012-07-17 VITALS — BP 150/76 | HR 75 | Ht 68.0 in | Wt 168.0 lb

## 2012-07-17 DIAGNOSIS — I6529 Occlusion and stenosis of unspecified carotid artery: Secondary | ICD-10-CM

## 2012-07-17 DIAGNOSIS — I6523 Occlusion and stenosis of bilateral carotid arteries: Secondary | ICD-10-CM

## 2012-07-17 DIAGNOSIS — Z7901 Long term (current) use of anticoagulants: Secondary | ICD-10-CM

## 2012-07-17 DIAGNOSIS — I4891 Unspecified atrial fibrillation: Secondary | ICD-10-CM

## 2012-07-17 DIAGNOSIS — R269 Unspecified abnormalities of gait and mobility: Secondary | ICD-10-CM

## 2012-07-17 DIAGNOSIS — I658 Occlusion and stenosis of other precerebral arteries: Secondary | ICD-10-CM

## 2012-07-17 LAB — POCT INR: INR: 3.4

## 2012-07-17 NOTE — Progress Notes (Signed)
[facility]  New Carotid Patient  Referred by:  Toma Deiters, MD No address on file  Reason for referral: R carotid stenosis  History of Present Illness  Phillip [name] is a 79 y.o. (11-22-1921) male who presents with chief complaint: prior history of dizziness.  3-4 weeks ago patient had dizziness and was found incidentally to have epididymitis.  After antibiotics, both went away.  As part of the dizziness work-up he had B carotid duplexes completed.  Previous carotid studies demonstrated: RICA 50-69% stenosis, LICA <50% stenosis.  Patient has no history of TIA or stroke symptom.  The patient has never had amaurosis fugax or monocular blindness.  The patient has bilateral macular degeneration with central vision decay.  The patient has nevern had facial drooping or hemiplegia.  The patient has known had receptive or expressive aphasia due to cognitive deficits related to his dementia.   The patient's previous neurologic deficits have not resolved.  The patient's risks factors for carotid disease include: diabetes, HTN.  Past Medical History  Diagnosis Date  . Left ventricular hypertrophy   . Permanent atrial fibrillation   . Fatigue   . Diabetes mellitus   . Long term (current) use of anticoagulants   . Hypertension   . Macular degeneration   . Hiatal hernia   . History of thrombocytopenia   . Dementia     Past Surgical History  Procedure Date  . Cholecystectomy   . Prostate surgery   . Hip surgery     right X2    History   Social History  . Marital Status: Married    Spouse Name: N/A    Number of Children: N/A  . Years of Education: N/A   Occupational History  . Not on file.   Social History Main Topics  . Smoking status: Never Smoker   . Smokeless tobacco: Former Neurosurgeon    Types: Chew  . Alcohol Use: No     Comment: use to drink 50 years ago  . Drug Use: No  . Sexually Active: Not on file   Other Topics Concern  . Not on file    Social History Narrative  . No narrative on file    Family History  Problem Relation Age of Onset  . Stroke Father   . Cancer Brother   . Coronary artery disease Sister     Current Outpatient Prescriptions on File Prior to Visit  Medication Sig Dispense Refill  . diltiazem (CARDIZEM CD) 120 MG 24 hr capsule Take 1 capsule (120 mg total) by mouth daily.  30 capsule  1  . GLIPIZIDE XL 5 MG 24 hr tablet Take 1 tablet by mouth Daily.      . meloxicam (MOBIC) 15 MG tablet Take 1 tablet by mouth Daily.      . metFORMIN (GLUCOPHAGE) 500 MG tablet Take 1 tablet by mouth Twice daily.      Marland Kitchen oxazepam (SERAX) 15 MG capsule Take 15 mg by mouth at bedtime.       . predniSONE (DELTASONE) 5 MG tablet Take 1 tablet by mouth Daily.      . ranitidine (ZANTAC) 300 MG tablet Take 300 mg by mouth at bedtime.        . rivastigmine (EXELON) 3 MG capsule Take 5 mg by mouth 2 (two) times daily.       Marland Kitchen warfarin (COUMADIN) 2.5 MG tablet TAKE AS DIRECTED  50 tablet  1  . meclizine (ANTIVERT)  25 MG tablet Take 25 mg by mouth 3 (three) times daily as needed.          No Known Allergies  REVIEW OF SYSTEMS:  (Positives checked otherwise negative)  CARDIOVASCULAR:  [ ]  chest pain, [ ]  chest pressure, [ ]  palpitations, [ ]  shortness of breath when laying flat, [x]  shortness of breath with exertion,   [ ]  pain in feet when walking, [ ]  pain in feet when laying flat, [ ]  history of blood clot in veins (DVT), [ ]  history of phlebitis, [ ]  swelling in legs, [ ]  varicose veins  PULMONARY:  [ ]  productive cough, [ ]  asthma, [ ]  wheezing  NEUROLOGIC:  [ ]  weakness in arms or legs, [ ]  numbness in arms or legs, [ ]  difficulty speaking or slurred speech, [ ]  temporary loss of vision in one eye, [ ]  dizziness  HEMATOLOGIC:  [ ]  bleeding problems, [ ]  problems with blood clotting too easily  MUSCULOSKEL:  [ ]  joint pain, [ ]  joint swelling  GASTROINTEST:  [ ]   Vomiting blood, [ ]   Blood in stool     GENITOURINARY:   [ ]   Burning with urination, [ ]   Blood in urine  PSYCHIATRIC:  [ ]  history of major depression  INTEGUMENTARY:  [ ]  rashes, [ ]  ulcers  CONSTITUTIONAL:  [ ]  fever, [ ]  chills  Physical Examination  Filed Vitals:   07/17/12 1435 07/17/12 1438  BP: 183/63 150/76  Pulse: 75   Height: 5\' 8"  (1.727 m)   Weight: 168 lb (76.204 kg)   SpO2: 97%    Body mass index is 25.54 kg/(m^2).  General: A&O x 3, WDWN  Head: /AT  Ear/Nose/Throat: Hearing grossly intact, nares w/o erythema or drainage, oropharynx w/o Erythema/Exudate, R hearing aid in place  Eyes: PERRLA, EOMI  Neck: Supple, no nuchal rigidity, no palpable LAD  Pulmonary: Sym exp, good air movt, CTAB, no rales, rhonchi, & wheezing  Cardiac: irr, irr, Nl S1, S2, no Murmurs, rubs or gallops  Vascular: Vessel Right Left  Radial Palpable Palpable  Ulnar Palpable Palpable  Brachial Palpable Palpable  Carotid Palpable, without bruit Palpable, without bruit  Aorta Not palpable N/A  Femoral Palpable Palpable  Popliteal Not palpable Not palpable  PT Palpable Palpable  DP Palpable Palpable   Gastrointestinal: soft, NTND, -G/R, - HSM, - masses, - CVAT B  Musculoskeletal: M/S 5/5 throughout , Extremities without ischemic changes   Neurologic: CN 2-12 intact , Pain and light touch intact in extremities , Motor exam as listed above, postural tremor  Psychiatric: Judgment intact, Mood & affect appropriate for pt's clinical situation  Dermatologic: See M/S exam for extremity exam, no rashes otherwise noted  Lymph : No Cervical, Axillary, or Inguinal lymphadenopathy   Non-Invasive Vascular Imaging  CAROTID DUPLEX (Date: 07/17/12):   R ICA stenosis: 60-79% (low end)  R VA: patent and antegrade  L ICA stenosis: 1-39%  L VA: patent and antegrade  Outside Studies/Documentation 5 pages of outside documents were reviewed including: outside carotid duplex and clinic chart.  Medical Decision Making  Phillip [name] is a  79 y.o. male who presents with: asx R ICA stenosis, minimal L ICA stenosis   Based on the patient's vascular studies and examination, I have offered the patient: q6 carotid duplex.  I discussed in depth with the patient the nature of atherosclerosis, and emphasized the importance of maximal medical management including strict control of blood pressure, blood glucose, and lipid  levels, obtaining regular exercise, antiplatelet agents, and cessation of smoking.    The patient is aware that without maximal medical management the underlying atherosclerotic disease process will progress, limiting the benefit of any interventions.  Thank you for allowing [place] to participate in this patient's care.  Leonides Sake, MD [facility] Office: [phone] Pager: [phone]  07/17/2012, 3:27 PM

## 2012-08-20 ENCOUNTER — Other Ambulatory Visit: Payer: Self-pay | Admitting: *Deleted

## 2012-08-20 MED ORDER — DILTIAZEM HCL ER COATED BEADS 120 MG PO CP24: 120.0000 mg | ORAL_CAPSULE | Freq: Every day | ORAL | Status: DC

## 2012-08-21 ENCOUNTER — Ambulatory Visit (INDEPENDENT_AMBULATORY_CARE_PROVIDER_SITE_OTHER): Payer: Medicare Other | Admitting: *Deleted

## 2012-08-21 DIAGNOSIS — I4891 Unspecified atrial fibrillation: Secondary | ICD-10-CM

## 2012-08-21 DIAGNOSIS — Z7901 Long term (current) use of anticoagulants: Secondary | ICD-10-CM

## 2012-09-08 ENCOUNTER — Ambulatory Visit (INDEPENDENT_AMBULATORY_CARE_PROVIDER_SITE_OTHER): Payer: Medicare Other | Admitting: Cardiology

## 2012-09-08 ENCOUNTER — Encounter: Payer: Self-pay | Admitting: Cardiology

## 2012-09-08 VITALS — BP 156/77 | HR 72 | Ht 69.0 in | Wt 167.0 lb

## 2012-09-08 DIAGNOSIS — I4891 Unspecified atrial fibrillation: Secondary | ICD-10-CM

## 2012-09-08 DIAGNOSIS — I658 Occlusion and stenosis of other precerebral arteries: Secondary | ICD-10-CM

## 2012-09-08 DIAGNOSIS — I6523 Occlusion and stenosis of bilateral carotid arteries: Secondary | ICD-10-CM

## 2012-09-08 DIAGNOSIS — I10 Essential (primary) hypertension: Secondary | ICD-10-CM

## 2012-09-08 DIAGNOSIS — I38 Endocarditis, valve unspecified: Secondary | ICD-10-CM

## 2012-09-08 NOTE — Assessment & Plan Note (Signed)
Symptomatically stable on medical therapy. No reported bleeding problems with Coumadin. Continue heart rate control with Cardizem CD.

## 2012-09-08 NOTE — Patient Instructions (Addendum)

## 2012-09-08 NOTE — Assessment & Plan Note (Signed)
Followed by Dr. Chen. 

## 2012-09-08 NOTE — Assessment & Plan Note (Signed)
Blood pressure is elevated today. He seems to be reasonably active even near 90. Discussed sodium restriction. No change to current regimen. Keep followup with Dr. Olena Leatherwood.

## 2012-09-08 NOTE — Progress Notes (Signed)
Clinical Summary [name] is an 79 y.o.male last seen in June 2013 by Dr. Andee Lineman. This is my first meeting with him. He is here with his wife today. He states that he has been doing very well, no significant palpitations, no chest pain, NYHA class II dyspnea which is chronic.  ECG today shows atrial fibrillation with LVH and repolarization abnormalities.  Carotid Dopplers from February of this year showed 60-79% RICA stenosis with 1-39% LICA stenosis. He is followed by Dr. Imogene Burn.  Echocardiogram from April 2010 revealed LVEF 50-55%, moderate to severe left atrial enlargement, mild to moderate mitral regurgitation, moderate aortic regurgitation, mild to moderate tricuspid regurgitation, RVSP 50-55 mm mercury. We discussed this today. Frankly, in light of his symptom stability, our plan will be to hold off on further echocardiography for now. Surgical management of his valvular disease would not likely be an option.  No Known Allergies  Current Outpatient Prescriptions  Medication Sig Dispense Refill  . diltiazem (CARDIZEM CD) 120 MG 24 hr capsule Take 1 capsule (120 mg total) by mouth daily.  30 capsule  0  . GLIPIZIDE XL 5 MG 24 hr tablet Take 1 tablet by mouth Daily.      . meloxicam (MOBIC) 15 MG tablet Take 1 tablet by mouth Daily.      . metFORMIN (GLUCOPHAGE) 500 MG tablet Take 1 tablet by mouth Twice daily.      Marland Kitchen oxazepam (SERAX) 15 MG capsule Take 15 mg by mouth at bedtime.       . predniSONE (DELTASONE) 5 MG tablet Take 1 tablet by mouth Daily.      . ranitidine (ZANTAC) 150 MG tablet Take 150 mg by mouth as needed.       . rivastigmine (EXELON) 3 MG capsule Take 5 mg by mouth 2 (two) times daily.       . rivastigmine (EXELON) 6 MG capsule Take 6 mg by mouth 2 (two) times daily.       Marland Kitchen warfarin (COUMADIN) 2.5 MG tablet TAKE AS DIRECTED  50 tablet  1   No current facility-administered medications for this visit.    Past Medical History  Diagnosis Date  . Permanent atrial  fibrillation   . Type 2 diabetes mellitus   . Long term (current) use of anticoagulants   . Essential hypertension, benign     LVH  . Macular degeneration   . Hiatal hernia   . History of thrombocytopenia   . Dementia     Social History [name]. He has quit using smokeless tobacco. His smokeless tobacco use included Chew. [name].  Review of Systems As outlined above.  Physical Examination Filed Vitals:   09/08/12 1439  BP: 156/77  Pulse: 72   Filed Weights   09/08/12 1439  Weight: 167 lb (75.751 kg)   No acute distress. HEENT: Conjunctiva and lids normal, oropharynx clear. Neck: Supple, no elevated JVP, right carotid bruit, no thyromegaly. Lungs: Clear to auscultation, nonlabored breathing at rest. Cardiac: Irregularly irregular, no S3, soft apical systolic murmur, no pericardial rub. Abdomen: Soft, nontender, bowel sounds present. Extremities: No pitting edema, distal pulses 2+.   Problem List and Plan   ATRIAL FIBRILLATION Symptomatically stable on medical therapy. No reported bleeding problems with Coumadin. Continue heart rate control with Cardizem CD.  Carotid stenosis, bilateral Followed by Dr. Imogene Burn.  VALVULAR HEART DISEASE History of AR and MR as outlined. He is  symptomatically stable. We will follow this conservatively.  Essential hypertension, benign Blood pressure is elevated today. He seems to be reasonably active even near 90. Discussed sodium restriction. No change to current regimen. Keep followup with Dr. Olena Leatherwood.    Jonelle Sidle, M.D., F.A.C.C.

## 2012-09-08 NOTE — Assessment & Plan Note (Signed)
History of AR and MR as outlined. He is symptomatically stable. We will follow this conservatively.

## 2012-09-18 ENCOUNTER — Other Ambulatory Visit: Payer: Self-pay | Admitting: Cardiology

## 2012-09-29 ENCOUNTER — Other Ambulatory Visit: Payer: Self-pay | Admitting: Physician Assistant

## 2012-10-02 ENCOUNTER — Ambulatory Visit (INDEPENDENT_AMBULATORY_CARE_PROVIDER_SITE_OTHER): Payer: Medicare Other | Admitting: *Deleted

## 2012-10-02 DIAGNOSIS — I4891 Unspecified atrial fibrillation: Secondary | ICD-10-CM

## 2012-10-02 DIAGNOSIS — Z7901 Long term (current) use of anticoagulants: Secondary | ICD-10-CM

## 2012-10-16 ENCOUNTER — Ambulatory Visit (INDEPENDENT_AMBULATORY_CARE_PROVIDER_SITE_OTHER): Payer: Medicare Other | Admitting: *Deleted

## 2012-10-16 DIAGNOSIS — I4891 Unspecified atrial fibrillation: Secondary | ICD-10-CM

## 2012-10-16 DIAGNOSIS — Z7901 Long term (current) use of anticoagulants: Secondary | ICD-10-CM

## 2012-10-16 LAB — POCT INR: INR: 2.9

## 2012-11-13 ENCOUNTER — Ambulatory Visit (INDEPENDENT_AMBULATORY_CARE_PROVIDER_SITE_OTHER): Payer: Medicare Other | Admitting: *Deleted

## 2012-11-13 DIAGNOSIS — Z7901 Long term (current) use of anticoagulants: Secondary | ICD-10-CM

## 2012-11-13 DIAGNOSIS — I4891 Unspecified atrial fibrillation: Secondary | ICD-10-CM

## 2012-11-13 LAB — POCT INR: INR: 2.8

## 2012-12-14 DIAGNOSIS — R079 Chest pain, unspecified: Secondary | ICD-10-CM

## 2012-12-15 ENCOUNTER — Encounter: Payer: Self-pay | Admitting: Cardiology

## 2012-12-15 DIAGNOSIS — R079 Chest pain, unspecified: Secondary | ICD-10-CM

## 2012-12-25 ENCOUNTER — Ambulatory Visit (INDEPENDENT_AMBULATORY_CARE_PROVIDER_SITE_OTHER): Payer: Medicare Other | Admitting: *Deleted

## 2012-12-25 DIAGNOSIS — Z7901 Long term (current) use of anticoagulants: Secondary | ICD-10-CM

## 2012-12-25 DIAGNOSIS — I4891 Unspecified atrial fibrillation: Secondary | ICD-10-CM

## 2012-12-25 LAB — POCT INR: INR: 2

## 2012-12-31 ENCOUNTER — Ambulatory Visit (INDEPENDENT_AMBULATORY_CARE_PROVIDER_SITE_OTHER): Payer: Medicare Other | Admitting: Physician Assistant

## 2012-12-31 ENCOUNTER — Other Ambulatory Visit: Payer: Self-pay | Admitting: *Deleted

## 2012-12-31 ENCOUNTER — Encounter: Payer: Self-pay | Admitting: Physician Assistant

## 2012-12-31 VITALS — BP 118/60 | HR 66 | Ht 70.0 in | Wt 166.0 lb

## 2012-12-31 DIAGNOSIS — I38 Endocarditis, valve unspecified: Secondary | ICD-10-CM

## 2012-12-31 DIAGNOSIS — I429 Cardiomyopathy, unspecified: Secondary | ICD-10-CM

## 2012-12-31 DIAGNOSIS — I428 Other cardiomyopathies: Secondary | ICD-10-CM

## 2012-12-31 DIAGNOSIS — E119 Type 2 diabetes mellitus without complications: Secondary | ICD-10-CM | POA: Insufficient documentation

## 2012-12-31 DIAGNOSIS — I10 Essential (primary) hypertension: Secondary | ICD-10-CM

## 2012-12-31 DIAGNOSIS — I4891 Unspecified atrial fibrillation: Secondary | ICD-10-CM

## 2012-12-31 DIAGNOSIS — R079 Chest pain, unspecified: Secondary | ICD-10-CM

## 2012-12-31 MED ORDER — NITROGLYCERIN 0.4 MG SL SUBL: 0.4000 mg | SUBLINGUAL_TABLET | SUBLINGUAL | Status: AC | PRN

## 2012-12-31 NOTE — Assessment & Plan Note (Signed)
Well-controlled on current medication regimen 

## 2012-12-31 NOTE — Telephone Encounter (Signed)
Fax Received. Refill Completed. Phillip [name] (R.M.A)  Per office visit, Rollen Sox requested for pt to start taking Nitro PRN and refill it for pt

## 2012-12-31 NOTE — Assessment & Plan Note (Signed)
Moderate MR, mild AS, and moderately severe TR, by recent echocardiogram. Continue to monitor clinically and with conservative medical therapy.

## 2012-12-31 NOTE — Assessment & Plan Note (Signed)
Patient denies any recurrent CP, following recent addition of Imdur. He has presumed CAD, based on prior nuclear imaging study results in 2010, which suggested underlying ischemic heart disease with inferior scar/peri-infarct ischemia. Continued conservative management is recommended. Patient to be provided with a prescription for NTG.

## 2012-12-31 NOTE — Assessment & Plan Note (Signed)
Permanent, adequately rate controlled on current dose Cardizem. On Coumadin anticoagulation, followed in our clinic.

## 2012-12-31 NOTE — Progress Notes (Signed)
Primary Cardiologist: Simona Huh, MD   HPI: Post hospital followup from [facility], seen in consultation by Dr. Diona Browner on July 8 for evaluation of chest pain. Troponins nondiagnostic with peak 0.04. Recommendation was to add Imdur 30 mg daily, with overall conservative medical management. Echocardiogram was obtained, and reviewed by Dr. Diona Browner:   - EF 40-45%, multiple WMAs; moderate MR; mild AS; moderately severe TR; moderate PHTN (RVSP 54 mmHg)  He presents today reporting no further CP. He has been taking the Imdur, which was recently added. His wife also suggests that he has not been experiencing any more DOE, as he had demonstrated a few days prior to his hospitalization.   Patient also denies any symptoms suggestive of decompensated heart failure. He denies any tachycardia palpitations.  No Known Allergies  Current Outpatient Prescriptions  Medication Sig Dispense Refill  . diltiazem (CARDIZEM CD) 120 MG 24 hr capsule TAKE ONE CAPSULE BY MOUTH DAILY  30 capsule  11  . GLIPIZIDE XL 5 MG 24 hr tablet Take 1 tablet by mouth Daily.      . isosorbide mononitrate (IMDUR) 30 MG 24 hr tablet Take 30 mg by mouth daily.      . meloxicam (MOBIC) 15 MG tablet Take 1 tablet by mouth Daily.      . metFORMIN (GLUCOPHAGE) 500 MG tablet Take 1 tablet by mouth Twice daily.      Marland Kitchen oxazepam (SERAX) 15 MG capsule Take 15 mg by mouth at bedtime.       . predniSONE (DELTASONE) 5 MG tablet Take 1 tablet by mouth Daily.      . ranitidine (ZANTAC) 150 MG tablet Take 150 mg by mouth as needed.       . rivastigmine (EXELON) 4.5 MG capsule Take 4.5 mg by mouth 2 (two) times daily.      Marland Kitchen warfarin (COUMADIN) 2.5 MG tablet TAKE AS DIRECTED  50 tablet  3   No current facility-administered medications for this visit.    Past Medical History  Diagnosis Date  . Permanent atrial fibrillation   . Type 2 diabetes mellitus   . Long term (current) use of anticoagulants   . Essential hypertension, benign     LVH  .  Macular degeneration   . Hiatal hernia   . History of thrombocytopenia   . Dementia   . Cardiomyopathy     Presumed ischemic, by abnormal Cardiolite, 2010; EF 40-45%, multiple WMAs; moderate MR; mild AS; moderately severe TR; moderate PHTN (RVSP 54 mmHg), 12/2012    Past Surgical History  Procedure Laterality Date  . Cholecystectomy    . Prostate surgery    . Hip surgery      right X2    History   Social History  . Marital Status: Married    Spouse Name: N/A    Number of Children: N/A  . Years of Education: N/A   Occupational History  . Not on file.   Social History Main Topics  . Smoking status: Never Smoker   . Smokeless tobacco: Former Neurosurgeon    Types: Chew  . Alcohol Use: No     Comment: use to drink 50 years ago  . Drug Use: No  . Sexually Active: Not on file   Other Topics Concern  . Not on file   Social History Narrative  . No narrative on file    Family History  Problem Relation Age of Onset  . Stroke Father   . Cancer Brother   . Coronary  artery disease Sister     ROS: no nausea, vomiting; no fever, chills; no melena, hematochezia; no claudication  PHYSICAL EXAM: BP 118/60  Pulse 66  Ht 5\' 10"  (1.778 m)  Wt 166 lb (75.297 kg)  BMI 23.82 kg/m2  SpO2 96% GENERAL: 79 year-old male; NAD HEENT: NCAT, PERRLA, EOMI; sclera clear; no xanthelasma NECK: palpable bilateral carotid pulses, no bruits; no JVD; no TM LUNGS: CTA bilaterally CARDIAC: Irregularly irregular (S1, S2); no significant murmurs; no rubs or gallops ABDOMEN: soft, non-tender; intact BS EXTREMETIES: trace-1+ peripheral edema SKIN: warm/dry; no obvious rash/lesions MUSCULOSKELETAL: no joint deformity NEURO: no focal deficit; NL affect   EKG:    ASSESSMENT & PLAN:  Chest pain Patient denies any recurrent CP, following recent addition of Imdur. He has presumed CAD, based on prior nuclear imaging study results in 2010, which suggested underlying ischemic heart disease with inferior  scar/peri-infarct ischemia. Continued conservative management is recommended. Patient to be provided with a prescription for NTG.  ATRIAL FIBRILLATION Permanent, adequately rate controlled on current dose Cardizem. On Coumadin anticoagulation, followed in our clinic.  Cardiomyopathy Patient appears euvolemic by history and exam. No evidence of definite CHF, by recent CXR. He is currently not on maintenance diuretic therapy. Will continue to monitor clinically.  Essential hypertension, benign Well-controlled on current medication regimen  Diabetes mellitus Followed by primary M.D. Of note, patient is currently not on a statin. Would consider adding a low-dose statin for plaque stabilization, with target LDL of 100 or less, if feasible. Will defer to primary care team.  VALVULAR HEART DISEASE Moderate MR, mild AS, and moderately severe TR, by recent echocardiogram. Continue to monitor clinically and with conservative medical therapy.    Gene Cayenne Breault, PAC

## 2012-12-31 NOTE — Assessment & Plan Note (Signed)
Patient appears euvolemic by history and exam. No evidence of definite CHF, by recent CXR. He is currently not on maintenance diuretic therapy. Will continue to monitor clinically.

## 2012-12-31 NOTE — Assessment & Plan Note (Signed)
Followed by primary M.D. Of note, patient is currently not on a statin. Would consider adding a low-dose statin for plaque stabilization, with target LDL of 100 or less, if feasible. Will defer to primary care team.

## 2012-12-31 NOTE — Patient Instructions (Addendum)
Your physician recommends that you schedule a follow-up appointment in:   IN 3 MONTHS WITH DR. [facility]  Your physician recommends that you continue on your current medications as directed. Please refer to the Current Medication list given to you today.  WILL FILL NITRO TODAY

## 2013-01-15 ENCOUNTER — Other Ambulatory Visit: Payer: Medicare Other

## 2013-01-15 ENCOUNTER — Ambulatory Visit: Payer: Medicare Other | Admitting: Neurosurgery

## 2013-01-18 ENCOUNTER — Other Ambulatory Visit (INDEPENDENT_AMBULATORY_CARE_PROVIDER_SITE_OTHER): Payer: Medicare Other | Admitting: Vascular Surgery

## 2013-01-18 DIAGNOSIS — I6529 Occlusion and stenosis of unspecified carotid artery: Secondary | ICD-10-CM

## 2013-01-20 ENCOUNTER — Other Ambulatory Visit: Payer: Self-pay | Admitting: *Deleted

## 2013-01-21 ENCOUNTER — Telehealth: Payer: Self-pay | Admitting: *Deleted

## 2013-01-21 NOTE — Telephone Encounter (Signed)
Message left on nurse's voicemail from wife saying patient was started on imdur 30 mg while in hospital on 12/14/12. Wife said this medication helped patient's breathing. Per wife, on yesterday, patient started having breathing problems again so she gave him an extra imdur. Nurse called and spoke with wife and she said on yesterday patient was sob. Wife confirmed that she gave an extra imdur and it helped patients sob. Per wife, patient had a little sob earlier today but is fine now. No c/o dizziness or chest pain.

## 2013-01-21 NOTE — Telephone Encounter (Signed)
Will try to stay with Imdur 30 mg daily for now, but if dyspnea increases we might consider increase standing dose to 60 mg daily. Treatment course has been conservative medical therapy so far.

## 2013-01-22 NOTE — Telephone Encounter (Signed)
Patient's wife informed and verbalizes understanding of plan.

## 2013-01-26 ENCOUNTER — Encounter: Payer: Self-pay | Admitting: Vascular Surgery

## 2013-02-09 ENCOUNTER — Ambulatory Visit (INDEPENDENT_AMBULATORY_CARE_PROVIDER_SITE_OTHER): Payer: Medicare Other | Admitting: *Deleted

## 2013-02-09 ENCOUNTER — Telehealth: Payer: Self-pay | Admitting: *Deleted

## 2013-02-09 DIAGNOSIS — Z7901 Long term (current) use of anticoagulants: Secondary | ICD-10-CM

## 2013-02-09 DIAGNOSIS — I4891 Unspecified atrial fibrillation: Secondary | ICD-10-CM

## 2013-02-09 NOTE — Telephone Encounter (Signed)
Patient saw Dr. Olena Leatherwood last week. Mrs. Benzel states that he checked her husbands coumdin please call .

## 2013-02-09 NOTE — Telephone Encounter (Signed)
Spoke with wife.  She did not know INR results.  Called Dr Polly Cobia office.  States they did not draw PT/INR.  Appt made for pt to come for INR check on 02/16/13.  Wife in agreement.

## 2013-02-12 ENCOUNTER — Encounter: Payer: Self-pay | Admitting: *Deleted

## 2013-02-12 NOTE — Progress Notes (Signed)
Per [facility] and [facility], Patient was placed on carvedilol on 02/05/13 by Dr. Camelia Eng, Unsure of dose.MD aware and advised information r/e interaction be sent to PCP. Information faxed to Hasanj's office.

## 2013-03-16 ENCOUNTER — Ambulatory Visit (INDEPENDENT_AMBULATORY_CARE_PROVIDER_SITE_OTHER): Payer: Medicare Other | Admitting: *Deleted

## 2013-03-16 DIAGNOSIS — I4891 Unspecified atrial fibrillation: Secondary | ICD-10-CM

## 2013-03-16 DIAGNOSIS — Z7901 Long term (current) use of anticoagulants: Secondary | ICD-10-CM

## 2013-03-30 ENCOUNTER — Encounter: Payer: Self-pay | Admitting: Cardiology

## 2013-03-30 ENCOUNTER — Ambulatory Visit (INDEPENDENT_AMBULATORY_CARE_PROVIDER_SITE_OTHER): Payer: Medicare Other | Admitting: Cardiology

## 2013-03-30 ENCOUNTER — Ambulatory Visit (INDEPENDENT_AMBULATORY_CARE_PROVIDER_SITE_OTHER): Payer: Medicare Other | Admitting: *Deleted

## 2013-03-30 VITALS — BP 117/67 | HR 70 | Ht 69.0 in | Wt 160.0 lb

## 2013-03-30 DIAGNOSIS — I4891 Unspecified atrial fibrillation: Secondary | ICD-10-CM

## 2013-03-30 DIAGNOSIS — I5022 Chronic systolic (congestive) heart failure: Secondary | ICD-10-CM

## 2013-03-30 DIAGNOSIS — Z7901 Long term (current) use of anticoagulants: Secondary | ICD-10-CM

## 2013-03-30 DIAGNOSIS — I428 Other cardiomyopathies: Secondary | ICD-10-CM

## 2013-03-30 DIAGNOSIS — I429 Cardiomyopathy, unspecified: Secondary | ICD-10-CM

## 2013-03-30 LAB — POCT INR: INR: 1.9

## 2013-03-30 NOTE — Patient Instructions (Signed)
Your physician recommends that you schedule a follow-up appointment in: 3 months. Your physician recommends that you continue on your current medications as directed. Please refer to the Current Medication list given to you today. 

## 2013-03-30 NOTE — Assessment & Plan Note (Signed)
Clinically improved. Weight is down 6 pounds by our scales. Continue current regimen and followup.

## 2013-03-30 NOTE — Progress Notes (Signed)
Clinical Summary Phillip [name] is an 79 y.o.male last seen in July by Mr. Serpe PA-C. Records indicate hospitalization at [facility] in August with heart failure exacerbation and delirium. He reports that he has been doing much better. States he feels "good" most of the time. Does not appear that he is wearing his oxygen regularly.  Lab work from August showed BUN 31, creatinine 1.2, potassium 4.5. Weight is down 6 pounds from last visit.  Echocardiogram from July of this year revealed mild LVH with LVEF 40-45%, wall motion abnormalities consistent with ischemic cardiomyopathy, moderate mitral regurgitation, mild aortic stenosis, mild to moderate aortic regurgitation, severely dilated right and left atrium, moderate to severe tricuspid regurgitation with RVSP 54 mm mercury.   No Known Allergies  Current Outpatient Prescriptions  Medication Sig Dispense Refill  . benazepril (LOTENSIN) 5 MG tablet Take 1 tablet by mouth daily.      . carvedilol (COREG) 6.25 MG tablet Take 1 tablet by mouth 2 (two) times daily.      Marland Kitchen diltiazem (CARDIZEM CD) 120 MG 24 hr capsule TAKE ONE CAPSULE BY MOUTH DAILY  30 capsule  11  . furosemide (LASIX) 40 MG tablet Take 1 tablet by mouth 2 (two) times daily.      Marland Kitchen GLIPIZIDE XL 5 MG 24 hr tablet Take 1 tablet by mouth Daily.      . isosorbide mononitrate (IMDUR) 30 MG 24 hr tablet Take 30 mg by mouth daily.      . metFORMIN (GLUCOPHAGE) 500 MG tablet Take 1 tablet by mouth Twice daily.      . nitroGLYCERIN (NITROSTAT) 0.4 MG SL tablet Place 1 tablet (0.4 mg total) under the tongue every 5 (five) minutes as needed for chest pain.  25 tablet  3  . oxazepam (SERAX) 15 MG capsule Take 15 mg by mouth at bedtime.       . potassium chloride (MICRO-K) 10 MEQ CR capsule Take 10 mEq by mouth daily.      . predniSONE (DELTASONE) 5 MG tablet Take 1 tablet by mouth Daily.      . ranitidine (ZANTAC) 150 MG tablet Take 150 mg by mouth as needed.       . rivastigmine (EXELON) 4.5  MG capsule Take 4.5 mg by mouth 2 (two) times daily.      Marland Kitchen warfarin (COUMADIN) 2.5 MG tablet TAKE AS DIRECTED  50 tablet  3   No current facility-administered medications for this visit.    Past Medical History  Diagnosis Date  . Permanent atrial fibrillation   . Type 2 diabetes mellitus   . Long term (current) use of anticoagulants   . Essential hypertension, benign     LVH  . Macular degeneration   . Hiatal hernia   . History of thrombocytopenia   . Dementia   . Cardiomyopathy     Presumed ischemic, by abnormal Cardiolite, 2010; EF 40-45%, multiple WMAs; moderate MR; mild AS; moderately severe TR; moderate PHTN (RVSP 54 mmHg), 12/2012    Social History Phillip [name] reports that he has never smoked. He has quit using smokeless tobacco. His smokeless tobacco use included Chew. Phillip [name] reports that he does not drink alcohol.  Review of Systems Hard of hearing. Decreased vision. He is not able to drive. No falls. Improved leg edema.  Physical Examination Filed Vitals:   03/30/13 1408  BP: 117/67  Pulse: 70   Filed Weights   03/30/13 1355  Weight: 160 lb (72.576 kg)  No acute distress.  HEENT: Conjunctiva and lids normal, oropharynx clear.  Neck: Supple, no elevated JVP, right carotid bruit, no thyromegaly.  Lungs: Clear to auscultation, nonlabored breathing at rest.  Cardiac: Irregularly irregular, no S3, soft apical systolic murmur, no pericardial rub.  Abdomen: Soft, nontender, bowel sounds present.  Extremities: No pitting edema, distal pulses 2+.    Problem List and Plan   Atrial fibrillation Continues on Coumadin for stroke prophylaxis, adjusted through the Coumadin clinic. Heart rate is adequately controlled on current regimen.  Cardiomyopathy Likely ischemic, managed conservatively at this time with medical therapy.  Chronic systolic heart failure Clinically improved. Weight is down 6 pounds by our scales. Continue current regimen and  followup.    Jonelle Sidle, M.D., F.A.C.C.

## 2013-03-30 NOTE — Assessment & Plan Note (Signed)
Likely ischemic, managed conservatively at this time with medical therapy.

## 2013-03-30 NOTE — Assessment & Plan Note (Signed)
Continues on Coumadin for stroke prophylaxis, adjusted through the Coumadin clinic. Heart rate is adequately controlled on current regimen.

## 2013-04-17 ENCOUNTER — Encounter: Payer: Self-pay | Admitting: Cardiology

## 2013-04-20 ENCOUNTER — Ambulatory Visit (INDEPENDENT_AMBULATORY_CARE_PROVIDER_SITE_OTHER): Payer: Medicare Other | Admitting: *Deleted

## 2013-04-20 DIAGNOSIS — I4891 Unspecified atrial fibrillation: Secondary | ICD-10-CM

## 2013-04-20 DIAGNOSIS — Z7901 Long term (current) use of anticoagulants: Secondary | ICD-10-CM

## 2013-05-04 ENCOUNTER — Other Ambulatory Visit: Payer: Self-pay | Admitting: Cardiology

## 2013-05-25 ENCOUNTER — Ambulatory Visit (INDEPENDENT_AMBULATORY_CARE_PROVIDER_SITE_OTHER): Payer: Medicare Other | Admitting: *Deleted

## 2013-05-25 DIAGNOSIS — I4891 Unspecified atrial fibrillation: Secondary | ICD-10-CM

## 2013-05-25 DIAGNOSIS — Z7901 Long term (current) use of anticoagulants: Secondary | ICD-10-CM

## 2013-06-29 ENCOUNTER — Ambulatory Visit (INDEPENDENT_AMBULATORY_CARE_PROVIDER_SITE_OTHER): Payer: Medicare Other | Admitting: *Deleted

## 2013-06-29 DIAGNOSIS — Z5181 Encounter for therapeutic drug level monitoring: Secondary | ICD-10-CM

## 2013-06-29 DIAGNOSIS — I4891 Unspecified atrial fibrillation: Secondary | ICD-10-CM

## 2013-06-29 DIAGNOSIS — Z7901 Long term (current) use of anticoagulants: Secondary | ICD-10-CM

## 2013-06-29 LAB — POCT INR: INR: 3.2

## 2013-07-05 ENCOUNTER — Ambulatory Visit (INDEPENDENT_AMBULATORY_CARE_PROVIDER_SITE_OTHER): Payer: Medicare Other | Admitting: Cardiology

## 2013-07-05 ENCOUNTER — Encounter: Payer: Self-pay | Admitting: Cardiology

## 2013-07-05 VITALS — BP 95/58 | HR 75 | Ht 70.0 in | Wt 156.0 lb

## 2013-07-05 DIAGNOSIS — I4891 Unspecified atrial fibrillation: Secondary | ICD-10-CM

## 2013-07-05 DIAGNOSIS — I10 Essential (primary) hypertension: Secondary | ICD-10-CM

## 2013-07-05 DIAGNOSIS — I38 Endocarditis, valve unspecified: Secondary | ICD-10-CM

## 2013-07-05 DIAGNOSIS — I5022 Chronic systolic (congestive) heart failure: Secondary | ICD-10-CM

## 2013-07-05 NOTE — Assessment & Plan Note (Signed)
Continue current regimen, no evidence of volume overload now, weight has decreased 4 pounds from last visit.

## 2013-07-05 NOTE — Progress Notes (Signed)
Clinical Summary [name] is an 80 y.o.male last seen in October 2014. He is here with his wife today. Being treated for a URI with antibiotics per Dr. Olena Leatherwood. He states he feels weak, has had chest congestion and cough. Stable appetite however. No chest pain. He uses supplemental oxygen most hours of the day.  Weight is down 4 pounds from our last visit in October.  Record review finds the ER visit at [facility] in November 2014. Lab work at that time showed BUN 35, creatinine 1.5, potassium 4.1, troponin I 0.04, BNP 506, hemoglobin 13.9, platelets 189.  Echocardiogram from July 2014 revealed mild LVH with LVEF 40-45%, wall motion abnormalities consistent with ischemic cardiomyopathy, moderate mitral regurgitation, mild aortic stenosis, mild to moderate aortic regurgitation, severely dilated right and left atrium, moderate to severe tricuspid regurgitation with RVSP 54 mm mercury.  Carotid Dopplers from August 2014 showed 60-79% RICA stenosis, less than 40% LICA stenosis.   No Known Allergies  Current Outpatient Prescriptions  Medication Sig Dispense Refill  . benazepril (LOTENSIN) 5 MG tablet Take 1 tablet by mouth daily.      Marland Kitchen diltiazem (CARDIZEM CD) 120 MG 24 hr capsule TAKE ONE CAPSULE BY MOUTH DAILY  30 capsule  11  . furosemide (LASIX) 40 MG tablet Take 1 tablet by mouth 2 (two) times daily.      Marland Kitchen GLIPIZIDE XL 5 MG 24 hr tablet Take 1 tablet by mouth Daily.      . isosorbide mononitrate (IMDUR) 30 MG 24 hr tablet Take 30 mg by mouth daily.      . metFORMIN (GLUCOPHAGE) 500 MG tablet Take 1 tablet by mouth Twice daily.      . nitroGLYCERIN (NITROSTAT) 0.4 MG SL tablet Place 1 tablet (0.4 mg total) under the tongue every 5 (five) minutes as needed for chest pain.  25 tablet  3  . oxazepam (SERAX) 15 MG capsule Take 15 mg by mouth at bedtime.       . penicillin v potassium (VEETID) 500 MG tablet Take 500 mg by mouth 3 (three) times daily.      . potassium chloride (MICRO-K) 10  MEQ CR capsule Take 10 mEq by mouth daily.      . predniSONE (DELTASONE) 5 MG tablet Take 1 tablet by mouth Daily.      . ranitidine (ZANTAC) 150 MG tablet Take 150 mg by mouth as needed.       . rivastigmine (EXELON) 4.5 MG capsule Take 4.5 mg by mouth 2 (two) times daily.      Marland Kitchen warfarin (COUMADIN) 2.5 MG tablet TAKE AS DIRECTED  50 tablet  3  . warfarin (COUMADIN) 2.5 MG tablet TAKE AS DIRECTED  50 tablet  3  . carvedilol (COREG) 6.25 MG tablet Take 1 tablet by mouth 2 (two) times daily.       No current facility-administered medications for this visit.    Past Medical History  Diagnosis Date  . Permanent atrial fibrillation   . Type 2 diabetes mellitus   . Long term (current) use of anticoagulants   . Essential hypertension, benign     LVH  . Macular degeneration   . Hiatal hernia   . History of thrombocytopenia   . Dementia   . Cardiomyopathy     Presumed ischemic, by abnormal Cardiolite, 2010; EF 40-45%, multiple WMAs; moderate MR; mild AS; moderately severe TR; moderate PHTN (RVSP 54 mmHg), 12/2012    Social History Mr. Malter reports that he  has never smoked. He has quit using smokeless tobacco. His smokeless tobacco use included Chew. Mr. Reed PandyRamsey reports that he does not drink alcohol.  Review of Systems No palpitations, no falls. Decreased hearing and vision. No peripheral edema. Otherwise negative.  Physical Examination Filed Vitals:   07/05/13 1020  BP: 95/58  Pulse: 75   Filed Weights   07/05/13 1020  Weight: 156 lb (70.761 kg)    No acute distress.  HEENT: Conjunctiva and lids normal, oropharynx clear.  Neck: Supple, no elevated JVP, right carotid bruit, no thyromegaly.  Lungs: Clear to auscultation, nonlabored breathing at rest.  Cardiac: Irregularly irregular, no S3, soft apical systolic murmur, no pericardial rub.  Abdomen: Soft, nontender, bowel sounds present.  Extremities: No pitting edema, distal pulses 2+.    Problem List and Plan   Chronic  systolic heart failure Continue current regimen, no evidence of volume overload now, weight has decreased 4 pounds from last visit.  Essential hypertension, benign Blood pressure mildly low today, asymptomatic.  Atrial fibrillation Rate controlled, continues on Coumadin.  VALVULAR HEART DISEASE Moderate MR, mild AS, and moderately severe TR. Continue to monitor clinically and with conservative medical therapy.    Jonelle SidleSamuel G. McDowell, M.D., F.A.C.C.

## 2013-07-05 NOTE — Patient Instructions (Signed)
Your physician recommends that you schedule a follow-up appointment in: 3 months. You will receive a reminder letter in the mail in about 1 month reminding you to call and schedule your appointment. If you don't receive this letter, please contact our office. Your physician recommends that you continue on your current medications as directed. Please refer to the Current Medication list given to you today. 

## 2013-07-05 NOTE — Assessment & Plan Note (Signed)
Rate controlled, continues on Coumadin. 

## 2013-07-05 NOTE — Assessment & Plan Note (Signed)
Moderate MR, mild AS, and moderately severe TR. Continue to monitor clinically and with conservative medical therapy. 

## 2013-07-05 NOTE — Assessment & Plan Note (Signed)
Blood pressure mildly low today, asymptomatic.

## 2013-07-22 ENCOUNTER — Encounter: Payer: Self-pay | Admitting: Vascular Surgery

## 2013-07-23 ENCOUNTER — Ambulatory Visit: Payer: Medicare Other | Admitting: Vascular Surgery

## 2013-07-23 ENCOUNTER — Inpatient Hospital Stay (HOSPITAL_COMMUNITY): Admission: RE | Admit: 2013-07-23 | Payer: Medicare Other | Source: Ambulatory Visit

## 2013-08-10 ENCOUNTER — Ambulatory Visit (INDEPENDENT_AMBULATORY_CARE_PROVIDER_SITE_OTHER): Payer: Medicare Other | Admitting: *Deleted

## 2013-08-10 DIAGNOSIS — I4891 Unspecified atrial fibrillation: Secondary | ICD-10-CM

## 2013-08-10 DIAGNOSIS — Z5181 Encounter for therapeutic drug level monitoring: Secondary | ICD-10-CM

## 2013-08-10 DIAGNOSIS — Z7901 Long term (current) use of anticoagulants: Secondary | ICD-10-CM

## 2013-08-10 LAB — POCT INR: INR: 3.1

## 2013-09-07 ENCOUNTER — Ambulatory Visit (INDEPENDENT_AMBULATORY_CARE_PROVIDER_SITE_OTHER): Payer: Medicare Other | Admitting: *Deleted

## 2013-09-07 DIAGNOSIS — Z7901 Long term (current) use of anticoagulants: Secondary | ICD-10-CM

## 2013-09-07 DIAGNOSIS — Z5181 Encounter for therapeutic drug level monitoring: Secondary | ICD-10-CM

## 2013-09-07 DIAGNOSIS — I4891 Unspecified atrial fibrillation: Secondary | ICD-10-CM

## 2013-09-07 LAB — POCT INR: INR: 2.5

## 2013-10-19 ENCOUNTER — Encounter (INDEPENDENT_AMBULATORY_CARE_PROVIDER_SITE_OTHER): Payer: Medicare Other | Admitting: *Deleted

## 2013-10-19 DIAGNOSIS — I4891 Unspecified atrial fibrillation: Secondary | ICD-10-CM

## 2013-10-19 DIAGNOSIS — Z7901 Long term (current) use of anticoagulants: Secondary | ICD-10-CM

## 2013-10-26 ENCOUNTER — Ambulatory Visit (INDEPENDENT_AMBULATORY_CARE_PROVIDER_SITE_OTHER): Payer: Medicare Other | Admitting: *Deleted

## 2013-10-26 ENCOUNTER — Encounter: Payer: Self-pay | Admitting: Cardiology

## 2013-10-26 ENCOUNTER — Ambulatory Visit (INDEPENDENT_AMBULATORY_CARE_PROVIDER_SITE_OTHER): Payer: Medicare Other | Admitting: Cardiology

## 2013-10-26 VITALS — BP 96/59 | HR 92 | Ht 70.0 in | Wt 163.0 lb

## 2013-10-26 DIAGNOSIS — I6523 Occlusion and stenosis of bilateral carotid arteries: Secondary | ICD-10-CM

## 2013-10-26 DIAGNOSIS — Z5181 Encounter for therapeutic drug level monitoring: Secondary | ICD-10-CM

## 2013-10-26 DIAGNOSIS — I4891 Unspecified atrial fibrillation: Secondary | ICD-10-CM

## 2013-10-26 DIAGNOSIS — I38 Endocarditis, valve unspecified: Secondary | ICD-10-CM

## 2013-10-26 DIAGNOSIS — I658 Occlusion and stenosis of other precerebral arteries: Secondary | ICD-10-CM

## 2013-10-26 DIAGNOSIS — I6529 Occlusion and stenosis of unspecified carotid artery: Secondary | ICD-10-CM

## 2013-10-26 LAB — POCT INR: INR: 2.7

## 2013-10-26 MED ORDER — BENAZEPRIL HCL 5 MG PO TABS: 2.5000 mg | ORAL_TABLET | Freq: Every day | ORAL | Status: DC

## 2013-10-26 NOTE — Progress Notes (Signed)
Clinical Summary [name] is a 80 y.o.male last seen in January of this year. He is here with his wife. Other than feeling weak and tired, he reports no palpitations or chest pain. Still wearing oxygen. INR was therapeutic earlier this morning. Denies any bleeding problems on Coumadin.  Echocardiogram from July 2014 revealed mild LVH with LVEF 40-45%, wall motion abnormalities consistent with ischemic cardiomyopathy, moderate mitral regurgitation, mild aortic stenosis, mild to moderate aortic regurgitation, severely dilated right and left atrium, moderate to severe tricuspid regurgitation with RVSP 54 mm mercury. Carotid Dopplers from August 2014 showed 60-79% RICA stenosis, less than 40% LICA stenosis.   No Known Allergies  Current Outpatient Prescriptions  Medication Sig Dispense Refill  . benazepril (LOTENSIN) 5 MG tablet Take 0.5 tablets (2.5 mg total) by mouth daily.  15 tablet  6  . carvedilol (COREG) 6.25 MG tablet Take 1 tablet by mouth 2 (two) times daily.      Marland Kitchen. diltiazem (CARDIZEM CD) 120 MG 24 hr capsule TAKE ONE CAPSULE BY MOUTH DAILY  30 capsule  11  . furosemide (LASIX) 40 MG tablet Take 1 tablet by mouth 2 (two) times daily.      Marland Kitchen. GLIPIZIDE XL 5 MG 24 hr tablet Take 1 tablet by mouth Daily.      . isosorbide mononitrate (IMDUR) 30 MG 24 hr tablet Take 30 mg by mouth daily.      . metFORMIN (GLUCOPHAGE) 500 MG tablet Take 1 tablet by mouth Twice daily.      . nitroGLYCERIN (NITROSTAT) 0.4 MG SL tablet Place 1 tablet (0.4 mg total) under the tongue every 5 (five) minutes as needed for chest pain.  25 tablet  3  . oxazepam (SERAX) 15 MG capsule Take 15 mg by mouth at bedtime.       . potassium chloride (MICRO-K) 10 MEQ CR capsule Take 10 mEq by mouth daily.      . predniSONE (DELTASONE) 5 MG tablet Take 1 tablet by mouth Daily.      . ranitidine (ZANTAC) 150 MG tablet Take 150 mg by mouth as needed.       . rivastigmine (EXELON) 6 MG capsule Take 6 mg by mouth 2 (two)  times daily.      Marland Kitchen. warfarin (COUMADIN) 2.5 MG tablet TAKE AS DIRECTED  Phillip [name](Phillip [name] managing)       No current facility-administered medications for this visit.    Past Medical History  Diagnosis Date  . Permanent atrial fibrillation   . Type 2 diabetes mellitus   . Long term (current) use of anticoagulants   . Essential hypertension, benign     LVH  . Macular degeneration   . Hiatal hernia   . History of thrombocytopenia   . Dementia   . Cardiomyopathy     Presumed ischemic, by abnormal Cardiolite, 2010; EF 40-45%, multiple WMAs; moderate MR; mild AS; moderately severe TR; moderate PHTN (RVSP 54 mmHg), 12/2012    Social History [name]. He has quit using smokeless tobacco. His smokeless tobacco use included Chew. [name].  Review of Systems Negative except as outlined.  Physical Examination Filed Vitals:   10/26/13 1116  BP: 96/59  Pulse: 92   Filed Weights   10/26/13 1116  Weight: 163 lb (73.936 kg)    No acute distress.  HEENT: Conjunctiva and lids normal, oropharynx clear.  Neck: Supple, no elevated JVP, right carotid bruit,  no thyromegaly.  Lungs: Clear to auscultation, nonlabored breathing at rest.  Cardiac: Irregularly irregular, no S3, soft apical systolic murmur, no pericardial rub.  Abdomen: Soft, nontender, bowel sounds present.  Extremities: No pitting edema, distal pulses 2+.    Problem List and Plan   Atrial fibrillation Rate controlled, continues on Coumadin.  Cardiomyopathy LVEF approximately 40-45%. Continue medical therapy, although we will decrease Lotensin to 2.5 mg daily in light of low normal blood pressure to see if this helps at all with any of his weakness.  Carotid stenosis, bilateral Followup carotid Doppler to be obtained for his next visit in 4 months.  VALVULAR HEART DISEASE Moderate MR, mild AS, and moderately severe TR. Continue to monitor clinically and  with conservative medical therapy.    Phillip [name], M.D., F.A.C.C.

## 2013-10-26 NOTE — Patient Instructions (Signed)
Your physician recommends that you schedule a follow-up appointment in: 4 months. Your physician has recommended you make the following change in your medication:  Decrease your benazepril to 2.5 mg daily. Please break your 5 mg tablet in half daily. Your new prescription was sent to your pharmacy. Continue all other medications the same. Your physician has requested that you have a carotid duplex in 4 months. This test is an ultrasound of the carotid arteries in your neck. It looks at blood flow through these arteries that supply the brain with blood. Allow one hour for this exam. There are no restrictions or special instructions.

## 2013-10-26 NOTE — Assessment & Plan Note (Signed)
Followup carotid Doppler to be obtained for his next visit in 4 months.

## 2013-10-26 NOTE — Assessment & Plan Note (Signed)
Moderate MR, mild AS, and moderately severe TR. Continue to monitor clinically and with conservative medical therapy.

## 2013-10-26 NOTE — Assessment & Plan Note (Signed)
Rate controlled, continues on Coumadin.

## 2013-10-26 NOTE — Assessment & Plan Note (Signed)
LVEF approximately 40-45%. Continue medical therapy, although we will decrease Lotensin to 2.5 mg daily in light of low normal blood pressure to see if this helps at all with any of his weakness.

## 2013-12-07 ENCOUNTER — Ambulatory Visit (INDEPENDENT_AMBULATORY_CARE_PROVIDER_SITE_OTHER): Payer: Medicare Other | Admitting: *Deleted

## 2013-12-07 DIAGNOSIS — Z7901 Long term (current) use of anticoagulants: Secondary | ICD-10-CM

## 2013-12-07 DIAGNOSIS — I4891 Unspecified atrial fibrillation: Secondary | ICD-10-CM

## 2013-12-07 DIAGNOSIS — I482 Chronic atrial fibrillation, unspecified: Secondary | ICD-10-CM

## 2013-12-07 DIAGNOSIS — Z5181 Encounter for therapeutic drug level monitoring: Secondary | ICD-10-CM

## 2013-12-07 LAB — POCT INR: INR: 2.4

## 2014-01-04 ENCOUNTER — Other Ambulatory Visit: Payer: Self-pay | Admitting: *Deleted

## 2014-01-04 MED ORDER — WARFARIN SODIUM 2.5 MG PO TABS: ORAL_TABLET | ORAL | Status: DC

## 2014-01-06 ENCOUNTER — Other Ambulatory Visit: Payer: Self-pay

## 2014-01-18 ENCOUNTER — Ambulatory Visit (INDEPENDENT_AMBULATORY_CARE_PROVIDER_SITE_OTHER): Payer: Medicare Other | Admitting: *Deleted

## 2014-01-18 DIAGNOSIS — Z7901 Long term (current) use of anticoagulants: Secondary | ICD-10-CM

## 2014-01-18 DIAGNOSIS — Z5181 Encounter for therapeutic drug level monitoring: Secondary | ICD-10-CM

## 2014-01-18 DIAGNOSIS — I4891 Unspecified atrial fibrillation: Secondary | ICD-10-CM

## 2014-01-18 LAB — POCT INR: INR: 2

## 2014-02-24 ENCOUNTER — Ambulatory Visit (INDEPENDENT_AMBULATORY_CARE_PROVIDER_SITE_OTHER): Payer: Medicare Other | Admitting: Cardiology

## 2014-02-24 DIAGNOSIS — I6523 Occlusion and stenosis of bilateral carotid arteries: Secondary | ICD-10-CM

## 2014-02-24 DIAGNOSIS — I6529 Occlusion and stenosis of unspecified carotid artery: Secondary | ICD-10-CM

## 2014-02-24 NOTE — Progress Notes (Signed)
Carotid Duplex performed. 

## 2014-03-02 ENCOUNTER — Ambulatory Visit (INDEPENDENT_AMBULATORY_CARE_PROVIDER_SITE_OTHER): Payer: Medicare Other | Admitting: *Deleted

## 2014-03-02 ENCOUNTER — Ambulatory Visit (INDEPENDENT_AMBULATORY_CARE_PROVIDER_SITE_OTHER): Payer: Medicare Other | Admitting: Cardiology

## 2014-03-02 ENCOUNTER — Encounter: Payer: Self-pay | Admitting: Cardiology

## 2014-03-02 VITALS — BP 104/67 | HR 72 | Ht 70.0 in | Wt 158.0 lb

## 2014-03-02 DIAGNOSIS — I4891 Unspecified atrial fibrillation: Secondary | ICD-10-CM

## 2014-03-02 DIAGNOSIS — Z7901 Long term (current) use of anticoagulants: Secondary | ICD-10-CM

## 2014-03-02 DIAGNOSIS — I10 Essential (primary) hypertension: Secondary | ICD-10-CM

## 2014-03-02 DIAGNOSIS — I6523 Occlusion and stenosis of bilateral carotid arteries: Secondary | ICD-10-CM

## 2014-03-02 DIAGNOSIS — I482 Chronic atrial fibrillation, unspecified: Secondary | ICD-10-CM

## 2014-03-02 DIAGNOSIS — Z5181 Encounter for therapeutic drug level monitoring: Secondary | ICD-10-CM

## 2014-03-02 DIAGNOSIS — I6529 Occlusion and stenosis of unspecified carotid artery: Secondary | ICD-10-CM

## 2014-03-02 DIAGNOSIS — I658 Occlusion and stenosis of other precerebral arteries: Secondary | ICD-10-CM

## 2014-03-02 DIAGNOSIS — I38 Endocarditis, valve unspecified: Secondary | ICD-10-CM

## 2014-03-02 LAB — POCT INR: INR: 2.3

## 2014-03-02 NOTE — Assessment & Plan Note (Signed)
Stable disease as outlined above.

## 2014-03-02 NOTE — Assessment & Plan Note (Signed)
Moderate MR, mild AS, and moderately severe TR. Continue to monitor clinically and with conservative medical therapy. 

## 2014-03-02 NOTE — Patient Instructions (Signed)
Your physician recommends that you schedule a follow-up appointment in: 4 months. You will receive a reminder letter in the mail in about 2 months reminding you to call and schedule your appointment. If you don't receive this letter, please contact our office. Your physician recommends that you continue on your current medications as directed. Please refer to the Current Medication list given to you today. Please check your blood pressures at home and call and let us know if your blood pressures are running too low.

## 2014-03-02 NOTE — Progress Notes (Signed)
Clinical Summary Phillip [name] is a 80 y.o.male last seen in May. ACE inhibitor dose was reduced at the last visit. He is not entirely certain whether this made much of an impact in terms of his weakness. Blood pressure is low normal today. His wife has not been checking it as regularly at home. Otherwise, he states he feels good without any chest pain, no nitroglycerin use.  Echocardiogram from July 2014 revealed mild LVH with LVEF 40-45%, wall motion abnormalities consistent with ischemic cardiomyopathy, moderate mitral regurgitation, mild aortic stenosis, mild to moderate aortic regurgitation, severely dilated right and left atrium, moderate to severe tricuspid regurgitation with RVSP 54 mm mercury.   Recent carotid Dopplers in September showed stable 60-79% RICA stenosis with nonobstructive plaque on the left.   No Known Allergies  Current Outpatient Prescriptions  Medication Sig Dispense Refill  . benazepril (LOTENSIN) 5 MG tablet Take 0.5 tablets (2.5 mg total) by mouth daily.  15 tablet  6  . diltiazem (CARDIZEM CD) 120 MG 24 hr capsule TAKE ONE CAPSULE BY MOUTH DAILY  30 capsule  11  . furosemide (LASIX) 40 MG tablet Take 1 tablet by mouth 2 (two) times daily.      Marland Kitchen GLIPIZIDE XL 5 MG 24 hr tablet Take 1 tablet by mouth Daily.      . isosorbide mononitrate (IMDUR) 30 MG 24 hr tablet Take 30 mg by mouth daily.      . metFORMIN (GLUCOPHAGE) 500 MG tablet Take 1 tablet by mouth Twice daily.      Marland Kitchen oxazepam (SERAX) 15 MG capsule Take 15 mg by mouth at bedtime.       . potassium chloride (MICRO-K) 10 MEQ CR capsule Take 10 mEq by mouth daily.      . predniSONE (DELTASONE) 5 MG tablet Take 1 tablet by mouth Daily.      . ranitidine (ZANTAC) 150 MG tablet Take 150 mg by mouth as needed.       . rivastigmine (EXELON) 6 MG capsule Take 6 mg by mouth 2 (two) times daily.      Marland Kitchen warfarin (COUMADIN) 2.5 MG tablet TAKE AS DIRECTED  (Lisa managing)  50 tablet  3  . nitroGLYCERIN (NITROSTAT) 0.4  MG SL tablet Place 1 tablet (0.4 mg total) under the tongue every 5 (five) minutes as needed for chest pain.  25 tablet  3   No current facility-administered medications for this visit.    Past Medical History  Diagnosis Date  . Permanent atrial fibrillation   . Type 2 diabetes mellitus   . Long term (current) use of anticoagulants   . Essential hypertension, benign     LVH  . Macular degeneration   . Hiatal hernia   . History of thrombocytopenia   . Dementia   . Cardiomyopathy     Presumed ischemic, by abnormal Cardiolite, 2010; EF 40-45%, multiple WMAs; moderate MR; mild AS; moderately severe TR; moderate PHTN (RVSP 54 mmHg), 12/2012    Social History Phillip [name] reports that he has never smoked. He has quit using smokeless tobacco. His smokeless tobacco use included Chew. Phillip [name] reports that he does not drink alcohol.  Review of Systems Other systems reviewed and negative except as outlined.  Physical Examination Filed Vitals:   03/02/14 1049  BP: 104/67  Pulse: 72   Filed Weights   03/02/14 1049  Weight: 158 lb (71.668 kg)    No acute distress.  HEENT: Conjunctiva and lids normal, oropharynx clear.  Neck: Supple, no elevated JVP, right carotid bruit, no thyromegaly.  Lungs: Clear to auscultation, nonlabored breathing at rest.  Cardiac: Irregularly irregular, no S3, soft apical systolic murmur, no pericardial rub.  Abdomen: Soft, nontender, bowel sounds present.  Extremities: No pitting edema, distal pulses 2+.    Problem List and Plan   Atrial fibrillation Permanent, continue heart rate control and anticoagulation.  VALVULAR HEART DISEASE Moderate MR, mild AS, and moderately severe TR. Continue to monitor clinically and with conservative medical therapy.  Essential hypertension, benign Check blood pressures at home, if systolic remains around 100, consistently under 110, I may go ahead and discontinue Lotensin altogether.  Carotid stenosis,  bilateral Stable disease as outlined above.    Jonelle Sidle, M.D., F.A.C.C.

## 2014-03-02 NOTE — Assessment & Plan Note (Signed)
Check blood pressures at home, if systolic remains around 100, consistently under 110, I may go ahead and discontinue Lotensin altogether.

## 2014-03-02 NOTE — Assessment & Plan Note (Signed)
Permanent, continue heart rate control and anticoagulation.

## 2014-03-11 ENCOUNTER — Encounter: Payer: Self-pay | Admitting: Cardiology

## 2014-04-12 ENCOUNTER — Ambulatory Visit (INDEPENDENT_AMBULATORY_CARE_PROVIDER_SITE_OTHER): Payer: Medicare Other | Admitting: *Deleted

## 2014-04-12 DIAGNOSIS — Z5181 Encounter for therapeutic drug level monitoring: Secondary | ICD-10-CM

## 2014-04-12 DIAGNOSIS — Z7901 Long term (current) use of anticoagulants: Secondary | ICD-10-CM

## 2014-04-12 DIAGNOSIS — I4891 Unspecified atrial fibrillation: Secondary | ICD-10-CM

## 2014-04-12 LAB — POCT INR: INR: 2.5

## 2014-05-24 ENCOUNTER — Ambulatory Visit (INDEPENDENT_AMBULATORY_CARE_PROVIDER_SITE_OTHER): Payer: Medicare Other | Admitting: *Deleted

## 2014-05-24 DIAGNOSIS — Z5181 Encounter for therapeutic drug level monitoring: Secondary | ICD-10-CM

## 2014-05-24 DIAGNOSIS — Z7901 Long term (current) use of anticoagulants: Secondary | ICD-10-CM

## 2014-05-24 DIAGNOSIS — I4891 Unspecified atrial fibrillation: Secondary | ICD-10-CM

## 2014-05-24 LAB — POCT INR: INR: 4.7

## 2014-06-07 ENCOUNTER — Ambulatory Visit (INDEPENDENT_AMBULATORY_CARE_PROVIDER_SITE_OTHER): Payer: Medicare Other | Admitting: *Deleted

## 2014-06-07 DIAGNOSIS — I4891 Unspecified atrial fibrillation: Secondary | ICD-10-CM

## 2014-06-07 DIAGNOSIS — Z5181 Encounter for therapeutic drug level monitoring: Secondary | ICD-10-CM

## 2014-06-07 DIAGNOSIS — Z7901 Long term (current) use of anticoagulants: Secondary | ICD-10-CM

## 2014-06-07 LAB — POCT INR: INR: 2.8

## 2014-07-04 ENCOUNTER — Encounter: Payer: Self-pay | Admitting: Cardiology

## 2014-07-04 ENCOUNTER — Ambulatory Visit (INDEPENDENT_AMBULATORY_CARE_PROVIDER_SITE_OTHER): Payer: Medicare Other | Admitting: Cardiology

## 2014-07-04 VITALS — BP 148/87 | HR 72 | Ht 70.0 in | Wt 158.8 lb

## 2014-07-04 DIAGNOSIS — I38 Endocarditis, valve unspecified: Secondary | ICD-10-CM

## 2014-07-04 DIAGNOSIS — I4891 Unspecified atrial fibrillation: Secondary | ICD-10-CM

## 2014-07-04 DIAGNOSIS — I10 Essential (primary) hypertension: Secondary | ICD-10-CM

## 2014-07-04 NOTE — Progress Notes (Signed)
Reason for visit: Atrial fibrillation, cardiomyopathy  Clinical Summary Phillip [name] is a 81 y.o.male last seen in September 2015. He is here with his wife today for a follow-up visit. States that he has more energy, has not had any falls, no palpitations or significant shortness of breath. ECG today shows rate-controlled atrial fibrillation with IVCD and LVH, leftward axis with repolarization abnormalities. He continues on Cardizem CD and Coumadin. Most recent INR was 2.8.   No Known Allergies  Current Outpatient Prescriptions  Medication Sig Dispense Refill  . diltiazem (CARDIZEM CD) 120 MG 24 hr capsule TAKE ONE CAPSULE BY MOUTH DAILY 30 capsule 11  . furosemide (LASIX) 40 MG tablet Take 1 tablet by mouth 2 (two) times daily.    Marland Kitchen GLIPIZIDE XL 5 MG 24 hr tablet Take 1 tablet by mouth Daily.    . isosorbide mononitrate (IMDUR) 30 MG 24 hr tablet Take 30 mg by mouth daily.    . nitroGLYCERIN (NITROSTAT) 0.4 MG SL tablet Place 1 tablet (0.4 mg total) under the tongue every 5 (five) minutes as needed for chest pain. 25 tablet 3  . oxazepam (SERAX) 15 MG capsule Take 15 mg by mouth at bedtime.     . potassium chloride (MICRO-K) 10 MEQ CR capsule Take 10 mEq by mouth daily.    . predniSONE (DELTASONE) 5 MG tablet Take 1 tablet by mouth Daily.    . ranitidine (ZANTAC) 150 MG tablet Take 150 mg by mouth as needed.     . rivastigmine (EXELON) 6 MG capsule Take 6 mg by mouth 2 (two) times daily.    . sodium bicarbonate 650 MG tablet Take 650 mg by mouth 2 (two) times daily.    Marland Kitchen warfarin (COUMADIN) 2.5 MG tablet TAKE AS DIRECTED  Misty Stanley managing) 50 tablet 3   No current facility-administered medications for this visit.    Past Medical History  Diagnosis Date  . Permanent atrial fibrillation   . Type 2 diabetes mellitus   . Long term (current) use of anticoagulants   . Essential hypertension, benign     LVH  . Macular degeneration   . Hiatal hernia   . History of thrombocytopenia   .  Dementia   . Cardiomyopathy     Presumed ischemic, by abnormal Cardiolite, 2010; EF 40-45%, multiple WMAs; moderate MR; mild AS; moderately severe TR; moderate PHTN (RVSP 54 mmHg), 12/2012    Social History Phillip [name] reports that he has never smoked. He has quit using smokeless tobacco. His smokeless tobacco use included Chew. Phillip [name] reports that he does not drink alcohol.  Review of Systems Complete review of systems negative except as otherwise outlined in the clinical summary and also the following. Appetite is fair. Has difficulty sleeping.  Physical Examination Filed Vitals:   07/04/14 1134  BP: 148/87  Pulse: 72    Wt Readings from Last 3 Encounters:  07/04/14 158 lb 12.8 oz (72.031 kg)  03/02/14 158 lb (71.668 kg)  10/26/13 163 lb (73.936 kg)   Appears comfortable at rest. HEENT: Conjunctiva and lids normal, oropharynx clear.  Neck: Supple, no elevated JVP, right carotid bruit, no thyromegaly.  Lungs: Clear to auscultation, nonlabored breathing at rest.  Cardiac: Irregularly irregular, no S3, soft apical systolic murmur, no pericardial rub.  Abdomen: Soft, nontender, bowel sounds present.  Extremities: No pitting edema, distal pulses 2+.    Problem List and Plan   Atrial fibrillation Continues strategy of heart rate control and anticoagulation, no change to Cardizem  CD or Coumadin regimen at this time. Follow-up in 6 months.   VALVULAR HEART DISEASE Moderate MR, mild AS, and moderately severe TR. Continue to monitor clinically and with conservative medical therapy.   Essential hypertension, benign Blood pressure is up somewhat today over prior baseline. He seems to feel better. He is now off ACE inhibitor altogether and we will continue observation.     Jonelle SidleSamuel G. McDowell, M.D., F.A.C.C.

## 2014-07-04 NOTE — Assessment & Plan Note (Signed)
Continues strategy of heart rate control and anticoagulation, no change to Cardizem CD or Coumadin regimen at this time. Follow-up in 6 months.

## 2014-07-04 NOTE — Assessment & Plan Note (Signed)
Moderate MR, mild AS, and moderately severe TR. Continue to monitor clinically and with conservative medical therapy. 

## 2014-07-04 NOTE — Assessment & Plan Note (Signed)
Blood pressure is up somewhat today over prior baseline. He seems to feel better. He is now off ACE inhibitor altogether and we will continue observation.

## 2014-07-04 NOTE — Patient Instructions (Signed)

## 2014-07-05 ENCOUNTER — Ambulatory Visit (INDEPENDENT_AMBULATORY_CARE_PROVIDER_SITE_OTHER): Payer: Medicare Other | Admitting: *Deleted

## 2014-07-05 DIAGNOSIS — I4891 Unspecified atrial fibrillation: Secondary | ICD-10-CM

## 2014-07-05 DIAGNOSIS — Z5181 Encounter for therapeutic drug level monitoring: Secondary | ICD-10-CM

## 2014-07-05 DIAGNOSIS — Z7901 Long term (current) use of anticoagulants: Secondary | ICD-10-CM

## 2014-07-05 LAB — POCT INR: INR: 5.2

## 2014-07-19 ENCOUNTER — Ambulatory Visit (INDEPENDENT_AMBULATORY_CARE_PROVIDER_SITE_OTHER): Payer: Medicare Other | Admitting: *Deleted

## 2014-07-19 DIAGNOSIS — I4891 Unspecified atrial fibrillation: Secondary | ICD-10-CM

## 2014-07-19 DIAGNOSIS — I480 Paroxysmal atrial fibrillation: Secondary | ICD-10-CM

## 2014-07-19 DIAGNOSIS — Z5181 Encounter for therapeutic drug level monitoring: Secondary | ICD-10-CM

## 2014-07-19 DIAGNOSIS — Z7901 Long term (current) use of anticoagulants: Secondary | ICD-10-CM

## 2014-07-19 LAB — POCT INR: INR: 2.3

## 2014-08-09 ENCOUNTER — Ambulatory Visit (INDEPENDENT_AMBULATORY_CARE_PROVIDER_SITE_OTHER): Payer: Medicare Other | Admitting: *Deleted

## 2014-08-09 DIAGNOSIS — I4891 Unspecified atrial fibrillation: Secondary | ICD-10-CM

## 2014-08-09 DIAGNOSIS — I480 Paroxysmal atrial fibrillation: Secondary | ICD-10-CM

## 2014-08-09 DIAGNOSIS — Z5181 Encounter for therapeutic drug level monitoring: Secondary | ICD-10-CM

## 2014-08-09 DIAGNOSIS — Z7901 Long term (current) use of anticoagulants: Secondary | ICD-10-CM

## 2014-08-09 LAB — POCT INR: INR: 2.8

## 2014-08-29 ENCOUNTER — Other Ambulatory Visit: Payer: Self-pay | Admitting: Cardiology

## 2014-09-06 ENCOUNTER — Ambulatory Visit (INDEPENDENT_AMBULATORY_CARE_PROVIDER_SITE_OTHER): Payer: Medicare Other | Admitting: *Deleted

## 2014-09-06 DIAGNOSIS — I4891 Unspecified atrial fibrillation: Secondary | ICD-10-CM

## 2014-09-06 DIAGNOSIS — Z5181 Encounter for therapeutic drug level monitoring: Secondary | ICD-10-CM

## 2014-09-06 DIAGNOSIS — I480 Paroxysmal atrial fibrillation: Secondary | ICD-10-CM

## 2014-09-06 DIAGNOSIS — Z7901 Long term (current) use of anticoagulants: Secondary | ICD-10-CM | POA: Diagnosis not present

## 2014-09-06 LAB — POCT INR: INR: 2.8

## 2014-10-18 ENCOUNTER — Ambulatory Visit (INDEPENDENT_AMBULATORY_CARE_PROVIDER_SITE_OTHER): Payer: Medicare Other | Admitting: *Deleted

## 2014-10-18 DIAGNOSIS — I480 Paroxysmal atrial fibrillation: Secondary | ICD-10-CM | POA: Diagnosis not present

## 2014-10-18 DIAGNOSIS — Z5181 Encounter for therapeutic drug level monitoring: Secondary | ICD-10-CM | POA: Diagnosis not present

## 2014-10-18 DIAGNOSIS — I4891 Unspecified atrial fibrillation: Secondary | ICD-10-CM

## 2014-10-18 DIAGNOSIS — Z7901 Long term (current) use of anticoagulants: Secondary | ICD-10-CM | POA: Diagnosis not present

## 2014-10-18 LAB — POCT INR: INR: 2.2

## 2014-11-29 ENCOUNTER — Ambulatory Visit (INDEPENDENT_AMBULATORY_CARE_PROVIDER_SITE_OTHER): Payer: Medicare Other | Admitting: *Deleted

## 2014-11-29 DIAGNOSIS — Z7901 Long term (current) use of anticoagulants: Secondary | ICD-10-CM | POA: Diagnosis not present

## 2014-11-29 DIAGNOSIS — I4891 Unspecified atrial fibrillation: Secondary | ICD-10-CM | POA: Diagnosis not present

## 2014-11-29 DIAGNOSIS — Z5181 Encounter for therapeutic drug level monitoring: Secondary | ICD-10-CM | POA: Diagnosis not present

## 2014-11-29 LAB — POCT INR: INR: 3.3

## 2014-12-20 ENCOUNTER — Ambulatory Visit: Payer: Medicare Other | Admitting: Cardiology

## 2014-12-22 ENCOUNTER — Ambulatory Visit (INDEPENDENT_AMBULATORY_CARE_PROVIDER_SITE_OTHER): Payer: Medicare Other | Admitting: *Deleted

## 2014-12-22 DIAGNOSIS — I480 Paroxysmal atrial fibrillation: Secondary | ICD-10-CM

## 2014-12-22 DIAGNOSIS — Z5181 Encounter for therapeutic drug level monitoring: Secondary | ICD-10-CM

## 2014-12-22 DIAGNOSIS — Z7901 Long term (current) use of anticoagulants: Secondary | ICD-10-CM

## 2014-12-22 DIAGNOSIS — I4891 Unspecified atrial fibrillation: Secondary | ICD-10-CM | POA: Diagnosis not present

## 2014-12-22 LAB — POCT INR: INR: 3.1

## 2015-01-10 ENCOUNTER — Ambulatory Visit (INDEPENDENT_AMBULATORY_CARE_PROVIDER_SITE_OTHER): Payer: Medicare Other | Admitting: Cardiology

## 2015-01-10 ENCOUNTER — Encounter: Payer: Self-pay | Admitting: Cardiology

## 2015-01-10 ENCOUNTER — Encounter: Payer: Self-pay | Admitting: *Deleted

## 2015-01-10 VITALS — BP 118/82 | HR 54 | Ht 69.0 in | Wt 170.0 lb

## 2015-01-10 DIAGNOSIS — I482 Chronic atrial fibrillation, unspecified: Secondary | ICD-10-CM

## 2015-01-10 DIAGNOSIS — I429 Cardiomyopathy, unspecified: Secondary | ICD-10-CM | POA: Diagnosis not present

## 2015-01-10 NOTE — Patient Instructions (Signed)
Continue all current medications. Your physician wants you to follow up in: 6 months.  You will receive a reminder letter in the mail one-two months in advance.  If you don't receive a letter, please call our office to schedule the follow up appointment   

## 2015-01-10 NOTE — Progress Notes (Signed)
Cardiology Office Note  Date: 01/10/2015   ID: Phillip, [name] 1921/12/22, MRN [id number]  PCP: Toma Deiters, MD  Primary Cardiologist: Nona Dell, MD   Chief Complaint  Patient presents with  . Atrial Fibrillation  . Cardiomyopathy    History of Present Illness: Phillip [name] is a 81 y.o. male last seen in January. He is here with his wife today for a follow-up visit. Reports recent evaluation by Dr. Olena Leatherwood, now on insulin. Otherwise no change in his cardiac regimen. He does not endorse any significant palpitations, no bleeding problems with Coumadin.  His orthostatic feeling of lightheadedness is fairly mild and intermittent at this point. He has other symptoms that sound more like vertigo.   Past Medical History  Diagnosis Date  . Permanent atrial fibrillation   . Type 2 diabetes mellitus   . Long term (current) use of anticoagulants   . Essential hypertension, benign     LVH  . Macular degeneration   . Hiatal hernia   . History of thrombocytopenia   . Dementia   . Cardiomyopathy     Presumed ischemic, by abnormal Cardiolite, 2010; EF 40-45%, multiple WMAs; moderate MR; mild AS; moderately severe TR; moderate PHTN (RVSP 54 mmHg), 12/2012    Current Outpatient Prescriptions  Medication Sig Dispense Refill  . calcium-vitamin D 250-100 MG-UNIT per tablet Take 1 tablet by mouth 2 (two) times daily.    . carvedilol (COREG) 6.25 MG tablet Take 6.25 mg by mouth 2 (two) times daily with a meal.    . GLIPIZIDE XL 5 MG 24 hr tablet Take 1 tablet by mouth Daily.    . Insulin Aspart (NOVOLOG FLEXPEN [place]) Inject into the skin.    Marland Kitchen isosorbide mononitrate (IMDUR) 30 MG 24 hr tablet Take 30 mg by mouth daily.    . nitroGLYCERIN (NITROSTAT) 0.4 MG SL tablet Place 1 tablet (0.4 mg total) under the tongue every 5 (five) minutes as needed for chest pain. 25 tablet 3  . oxazepam (SERAX) 15 MG capsule Take 15 mg by mouth at bedtime.     . predniSONE (DELTASONE) 5 MG tablet  Take 1 tablet by mouth Daily.    . ranitidine (ZANTAC) 150 MG tablet Take 150 mg by mouth as needed.     . sodium bicarbonate 650 MG tablet Take 650 mg by mouth 2 (two) times daily.    Marland Kitchen torsemide (DEMADEX) 20 MG tablet Take 20 mg by mouth 2 (two) times daily. Take 2 tablets twice daily    . warfarin (COUMADIN) 2.5 MG tablet TAKE AS DIRECTED 50 tablet 3   No current facility-administered medications for this visit.    Allergies:  Review of patient's allergies indicates no known allergies.   Social History: The patient  reports that he has never smoked. He has quit using smokeless tobacco. His smokeless tobacco use included Chew. He reports that he does not drink alcohol or use illicit drugs.   ROS:  Please see the history of present illness. Otherwise, complete review of systems is positive for decreased hearing.  All other systems are reviewed and negative.   Physical Exam: VS:  BP 118/82 mmHg  Pulse 54  Ht 5\' 9"  (1.753 m)  Wt 170 lb (77.111 kg)  BMI 25.09 kg/m2  SpO2 96%, BMI Body mass index is 25.09 kg/(m^2).  Wt Readings from Last 3 Encounters:  01/10/15 170 lb (77.111 kg)  07/04/14 158 lb 12.8 oz (72.031 kg)  03/02/14 158 lb (71.668  kg)     Appears comfortable at rest. HEENT: Conjunctiva and lids normal, oropharynx clear.  Neck: Supple, no elevated JVP, right carotid bruit, no thyromegaly.  Lungs: Clear to auscultation, nonlabored breathing at rest.  Cardiac: Irregularly irregular, no S3, soft apical systolic murmur, no pericardial rub.  Abdomen: Soft, nontender, bowel sounds present.  Extremities: No pitting edema, distal pulses 2+.    ECG: Tracing from 07/04/2014 showed atrial fibrillation at 87 bpm with LVH and repolarization abnormalities.   Assessment and Plan:  1. Chronic atrial fibrillation, continue strategy of heart rate control and anticoagulation. He is followed in the anticoagulation clinic.  2. Ischemic cardiomyopathy, being managed conservatively  at this point and clinically stable.  Current medicines were reviewed with the patient today.  Disposition: FU with me in 6 months.   Signed, Jonelle Sidle, MD, [facility] 01/10/2015 2:38 PM    [facility] at [facility] [address] [place], [place], [place] [address] Phone: [phone]; Fax: [phone]

## 2015-01-19 ENCOUNTER — Ambulatory Visit (INDEPENDENT_AMBULATORY_CARE_PROVIDER_SITE_OTHER): Payer: Medicare Other | Admitting: *Deleted

## 2015-01-19 DIAGNOSIS — Z7901 Long term (current) use of anticoagulants: Secondary | ICD-10-CM

## 2015-01-19 DIAGNOSIS — I4891 Unspecified atrial fibrillation: Secondary | ICD-10-CM

## 2015-01-19 DIAGNOSIS — Z5181 Encounter for therapeutic drug level monitoring: Secondary | ICD-10-CM | POA: Diagnosis not present

## 2015-01-19 LAB — POCT INR: INR: 2.6

## 2015-02-28 ENCOUNTER — Ambulatory Visit (INDEPENDENT_AMBULATORY_CARE_PROVIDER_SITE_OTHER): Payer: Medicare Other | Admitting: *Deleted

## 2015-02-28 DIAGNOSIS — Z5181 Encounter for therapeutic drug level monitoring: Secondary | ICD-10-CM

## 2015-02-28 DIAGNOSIS — I4891 Unspecified atrial fibrillation: Secondary | ICD-10-CM | POA: Diagnosis not present

## 2015-02-28 DIAGNOSIS — Z7901 Long term (current) use of anticoagulants: Secondary | ICD-10-CM

## 2015-02-28 LAB — POCT INR: INR: 3

## 2015-02-28 MED ORDER — WARFARIN SODIUM 2.5 MG PO TABS: ORAL_TABLET | ORAL | Status: AC

## 2015-04-04 ENCOUNTER — Ambulatory Visit (INDEPENDENT_AMBULATORY_CARE_PROVIDER_SITE_OTHER): Payer: Medicare Other | Admitting: *Deleted

## 2015-04-04 DIAGNOSIS — Z7901 Long term (current) use of anticoagulants: Secondary | ICD-10-CM | POA: Diagnosis not present

## 2015-04-04 DIAGNOSIS — I4891 Unspecified atrial fibrillation: Secondary | ICD-10-CM | POA: Diagnosis not present

## 2015-04-04 DIAGNOSIS — Z5181 Encounter for therapeutic drug level monitoring: Secondary | ICD-10-CM | POA: Diagnosis not present

## 2015-04-04 LAB — POCT INR: INR: 2.9

## 2015-05-16 ENCOUNTER — Ambulatory Visit (INDEPENDENT_AMBULATORY_CARE_PROVIDER_SITE_OTHER): Payer: Medicare Other | Admitting: *Deleted

## 2015-05-16 DIAGNOSIS — Z5181 Encounter for therapeutic drug level monitoring: Secondary | ICD-10-CM

## 2015-05-16 DIAGNOSIS — I4891 Unspecified atrial fibrillation: Secondary | ICD-10-CM | POA: Diagnosis not present

## 2015-05-16 DIAGNOSIS — Z7901 Long term (current) use of anticoagulants: Secondary | ICD-10-CM | POA: Diagnosis not present

## 2015-05-16 LAB — POCT INR: INR: 3.6

## 2015-06-08 ENCOUNTER — Ambulatory Visit (INDEPENDENT_AMBULATORY_CARE_PROVIDER_SITE_OTHER): Payer: Medicare Other | Admitting: *Deleted

## 2015-06-08 DIAGNOSIS — Z5181 Encounter for therapeutic drug level monitoring: Secondary | ICD-10-CM

## 2015-06-08 DIAGNOSIS — I4891 Unspecified atrial fibrillation: Secondary | ICD-10-CM | POA: Diagnosis not present

## 2015-06-08 DIAGNOSIS — Z7901 Long term (current) use of anticoagulants: Secondary | ICD-10-CM | POA: Diagnosis not present

## 2015-06-08 LAB — POCT INR: INR: 2.3

## 2015-07-17 ENCOUNTER — Encounter: Payer: Self-pay | Admitting: *Deleted

## 2015-07-17 ENCOUNTER — Ambulatory Visit (INDEPENDENT_AMBULATORY_CARE_PROVIDER_SITE_OTHER): Payer: Medicare Other | Admitting: Cardiology

## 2015-07-17 ENCOUNTER — Encounter: Payer: Self-pay | Admitting: Cardiology

## 2015-07-17 ENCOUNTER — Ambulatory Visit (INDEPENDENT_AMBULATORY_CARE_PROVIDER_SITE_OTHER): Payer: Medicare Other | Admitting: *Deleted

## 2015-07-17 VITALS — BP 106/71 | HR 87 | Ht 69.0 in | Wt 170.4 lb

## 2015-07-17 DIAGNOSIS — Z7901 Long term (current) use of anticoagulants: Secondary | ICD-10-CM

## 2015-07-17 DIAGNOSIS — Z8701 Personal history of pneumonia (recurrent): Secondary | ICD-10-CM

## 2015-07-17 DIAGNOSIS — I429 Cardiomyopathy, unspecified: Secondary | ICD-10-CM

## 2015-07-17 DIAGNOSIS — I4891 Unspecified atrial fibrillation: Secondary | ICD-10-CM | POA: Diagnosis not present

## 2015-07-17 DIAGNOSIS — I482 Chronic atrial fibrillation, unspecified: Secondary | ICD-10-CM

## 2015-07-17 DIAGNOSIS — Z5181 Encounter for therapeutic drug level monitoring: Secondary | ICD-10-CM

## 2015-07-17 LAB — POCT INR: INR: 3.2

## 2015-07-17 NOTE — Patient Instructions (Signed)
Your physician recommends that you continue on your current medications as directed. Please refer to the Current Medication list given to you today. Your physician recommends that you schedule a follow-up appointment in: 6 months. You will receive a reminder letter in the mail in about 4 months reminding you to call and schedule your appointment. If you don't receive this letter, please contact our office. 

## 2015-07-17 NOTE — Progress Notes (Signed)
Cardiology Office Note  Date: 07/17/2015   ID: Phillip [name], Phillip [name] Oct 24, 1921, MRN [id number]  PCP: Toma Deiters, MD  Primary Cardiologist: Nona Dell, MD   Chief Complaint  Patient presents with  . Atrial Fibrillation    History of Present Illness: Phillip [name] is a 82 y.o. male last seen in August 2016. He is here today with his wife. My understanding is that he was just recently discharged from [facility] after a five-day stay with pneumonia. He was managed by Dr. Olena Leatherwood. I do not yet have records for review. She states that he is still weak, but he indicates that he feels much better overall.  He is not reporting any chest pain or palpitations. Remains in atrial fibrillation on examination today.  He continues on Coumadin with follow-up in the anticoagulation clinic.due for follow-up INR today which will be obtained.  Past Medical History  Diagnosis Date  . Permanent atrial fibrillation (HCC)   . Type 2 diabetes mellitus (HCC)   . Long term (current) use of anticoagulants   . Essential hypertension, benign     LVH  . Macular degeneration   . Hiatal hernia   . History of thrombocytopenia   . Dementia   . Cardiomyopathy (HCC)     Presumed ischemic, by abnormal Cardiolite, 2010; EF 40-45%, multiple WMAs; moderate MR; mild AS; moderately severe TR; moderate PHTN (RVSP 54 mmHg), 12/2012    Current Outpatient Prescriptions  Medication Sig Dispense Refill  . calcium-vitamin D 250-100 MG-UNIT per tablet Take 1 tablet by mouth 2 (two) times daily.    . carvedilol (COREG) 6.25 MG tablet Take 6.25 mg by mouth 2 (two) times daily with a meal.    . GLIPIZIDE XL 5 MG 24 hr tablet Take 1 tablet by mouth Daily.    . Insulin Aspart (NOVOLOG FLEXPEN [place]) Inject into the skin.    Marland Kitchen isosorbide mononitrate (IMDUR) 30 MG 24 hr tablet Take 30 mg by mouth daily.    . nitroGLYCERIN (NITROSTAT) 0.4 MG SL tablet Place 1 tablet (0.4 mg total) under the tongue every 5 (five) minutes as  needed for chest pain. 25 tablet 3  . oxazepam (SERAX) 15 MG capsule Take 15 mg by mouth at bedtime.     . predniSONE (DELTASONE) 5 MG tablet Take 1 tablet by mouth Daily.    . ranitidine (ZANTAC) 150 MG tablet Take 150 mg by mouth as needed.     . sodium bicarbonate 650 MG tablet Take 650 mg by mouth 2 (two) times daily.    Marland Kitchen torsemide (DEMADEX) 20 MG tablet Take 20 mg by mouth 2 (two) times daily. Take 2 tablets twice daily    . warfarin (COUMADIN) 2.5 MG tablet TAKE AS DIRECTED 50 tablet 3   No current facility-administered medications for this visit.   Allergies:  Review of patient's allergies indicates no known allergies.   Social History: The patient  reports that he has never smoked. He has quit using smokeless tobacco. His smokeless tobacco use included Chew. He reports that he does not drink alcohol or use illicit drugs.   ROS:  Please see the history of present illness. Otherwise, complete review of systems is positive for significantly diminished hearing.  All other systems are reviewed and negative.   Physical Exam: VS:  BP 106/71 mmHg  Pulse 87  Ht  (1.753 m)  Wt 170 lb 6.4 oz (77.293 kg)  BMI 25.15 kg/m2  SpO2 94%, BMI Body mass  index is 25.15 kg/(m^2).  Wt Readings from Last 3 Encounters:  07/17/15 170 lb 6.4 oz (77.293 kg)  01/10/15 170 lb (77.111 kg)  07/04/14 158 lb 12.8 oz (72.031 kg)    Appears comfortable at rest. HEENT: Conjunctiva and lids normal, oropharynx clear.  Neck: Supple, no elevated JVP, right carotid bruit, no thyromegaly.  Lungs: Clear to auscultation, nonlabored breathing at rest.  Cardiac: Irregularly irregular, no S3, soft apical systolic murmur, no pericardial rub.  Abdomen: Soft, nontender, bowel sounds present.  Extremities: No pitting edema, distal pulses 2+.  ECG: ECG is not ordered today.  Assessment and Plan:  1. Chronic atrial fibrillation. Continue strategy of heart rate control anticoagulation. Follow-up INR  today.  2. Ischemic cardiomyopathy, continuing with conservative management.  3. Reported recent hospitalization at [facility] with pneumonia. Requesting records.  Current medicines were reviewed with the patient today.  Disposition: FU with me in 6 months.   Signed, Jonelle Sidle, MD, [facility] 07/17/2015 3:05 PM    [facility] at [facility] [address] [place], [place], [place] [address] Phone: [phone]; Fax: [phone]

## 2015-08-08 ENCOUNTER — Ambulatory Visit (INDEPENDENT_AMBULATORY_CARE_PROVIDER_SITE_OTHER): Payer: Medicare Other | Admitting: *Deleted

## 2015-08-08 DIAGNOSIS — I4891 Unspecified atrial fibrillation: Secondary | ICD-10-CM

## 2015-08-08 DIAGNOSIS — Z5181 Encounter for therapeutic drug level monitoring: Secondary | ICD-10-CM

## 2015-08-08 LAB — POCT INR: INR: 7.2

## 2015-08-10 ENCOUNTER — Ambulatory Visit (INDEPENDENT_AMBULATORY_CARE_PROVIDER_SITE_OTHER): Payer: Medicare Other | Admitting: *Deleted

## 2015-08-10 DIAGNOSIS — I4891 Unspecified atrial fibrillation: Secondary | ICD-10-CM | POA: Diagnosis not present

## 2015-08-10 DIAGNOSIS — Z5181 Encounter for therapeutic drug level monitoring: Secondary | ICD-10-CM

## 2015-08-10 LAB — POCT INR: INR: 5.1

## 2015-08-17 ENCOUNTER — Ambulatory Visit (INDEPENDENT_AMBULATORY_CARE_PROVIDER_SITE_OTHER): Payer: Medicare Other | Admitting: *Deleted

## 2015-08-17 DIAGNOSIS — Z5181 Encounter for therapeutic drug level monitoring: Secondary | ICD-10-CM | POA: Diagnosis not present

## 2015-08-17 DIAGNOSIS — I4891 Unspecified atrial fibrillation: Secondary | ICD-10-CM

## 2015-08-17 LAB — POCT INR: INR: 2.3

## 2015-09-07 ENCOUNTER — Telehealth: Payer: Self-pay | Admitting: *Deleted

## 2015-09-07 NOTE — Telephone Encounter (Signed)
Wife came by to cancel INR appt this am because [facility].  He was d/c today.  He was sent home on levaquin 500mg  daily x 7 days and Ceftin 250mg  BID x 7 days.  Told wife to have pt hold his coumadin on Friday and Monday night (1/2 tablet other days=1.25mg ) and come for INR appt on Tuesday 09/12/15.  She verbalized understanding.

## 2015-09-14 ENCOUNTER — Ambulatory Visit (INDEPENDENT_AMBULATORY_CARE_PROVIDER_SITE_OTHER): Payer: Medicare Other | Admitting: *Deleted

## 2015-09-14 DIAGNOSIS — I4891 Unspecified atrial fibrillation: Secondary | ICD-10-CM

## 2015-09-14 DIAGNOSIS — Z5181 Encounter for therapeutic drug level monitoring: Secondary | ICD-10-CM | POA: Diagnosis not present

## 2015-09-14 LAB — POCT INR: INR: 1.5

## 2015-10-09 DEATH — deceased
